# Patient Record
Sex: Female | Born: 1995 | Hispanic: No | Marital: Single | Smoking: Never smoker
Health system: Southern US, Community
[De-identification: ages and names within clinical notes are randomized; demographics above are authoritative.]

## PROBLEM LIST (undated history)

## (undated) ENCOUNTER — Inpatient Hospital Stay (HOSPITAL_COMMUNITY): Payer: Self-pay

## (undated) DIAGNOSIS — Z85858 Personal history of malignant neoplasm of other endocrine glands: Secondary | ICD-10-CM

## (undated) DIAGNOSIS — J189 Pneumonia, unspecified organism: Secondary | ICD-10-CM

## (undated) DIAGNOSIS — K769 Liver disease, unspecified: Secondary | ICD-10-CM

## (undated) DIAGNOSIS — Z862 Personal history of diseases of the blood and blood-forming organs and certain disorders involving the immune mechanism: Secondary | ICD-10-CM

## (undated) DIAGNOSIS — C801 Malignant (primary) neoplasm, unspecified: Secondary | ICD-10-CM

## (undated) DIAGNOSIS — Z9221 Personal history of antineoplastic chemotherapy: Secondary | ICD-10-CM

## (undated) DIAGNOSIS — J45909 Unspecified asthma, uncomplicated: Secondary | ICD-10-CM

## (undated) HISTORY — DX: Liver disease, unspecified: K76.9

## (undated) HISTORY — DX: Personal history of diseases of the blood and blood-forming organs and certain disorders involving the immune mechanism: Z86.2

## (undated) HISTORY — DX: Personal history of antineoplastic chemotherapy: Z92.21

## (undated) HISTORY — DX: Pneumonia, unspecified organism: J18.9

## (undated) HISTORY — DX: Malignant (primary) neoplasm, unspecified: C80.1

## (undated) HISTORY — DX: Personal history of malignant neoplasm of other endocrine glands: Z85.858

## (undated) HISTORY — PX: OTHER SURGICAL HISTORY: SHX169

---
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
## 2017-10-15 ENCOUNTER — Ambulatory Visit (INDEPENDENT_AMBULATORY_CARE_PROVIDER_SITE_OTHER): Payer: Medicaid Other | Admitting: *Deleted

## 2017-10-15 ENCOUNTER — Encounter: Payer: Self-pay | Admitting: General Practice

## 2017-10-15 ENCOUNTER — Other Ambulatory Visit: Payer: Self-pay

## 2017-10-15 VITALS — BP 123/84 | HR 96 | Temp 98.4°F | Ht 61.0 in | Wt 196.4 lb

## 2017-10-15 DIAGNOSIS — Z3200 Encounter for pregnancy test, result unknown: Secondary | ICD-10-CM

## 2017-10-15 DIAGNOSIS — Z3400 Encounter for supervision of normal first pregnancy, unspecified trimester: Secondary | ICD-10-CM

## 2017-10-15 DIAGNOSIS — Z3201 Encounter for pregnancy test, result positive: Secondary | ICD-10-CM

## 2017-10-15 LAB — POCT URINE PREGNANCY: Preg Test, Ur: POSITIVE — AB

## 2017-10-15 MED ORDER — PRENATAL GUMMIES/DHA & FA 0.4-32.5 MG PO CHEW: 3.0000 | CHEWABLE_TABLET | Freq: Every day | ORAL | 12 refills | Status: DC

## 2017-10-15 NOTE — Progress Notes (Signed)
     Ms. Bousquet presents today for UPT. She has no unusual complaints. LMP: 09/13/2017  EDD: 06/20/2018, G1P0; [redacted]w[redacted]d, dating LMP: 09/13/17.    OBJECTIVE: Appears well, in no apparent distress.  OB History    Gravida  1   Para      Term      Preterm      AB      Living        SAB      TAB      Ectopic      Multiple      Live Births             Home UPT Result:  Positive x 2 tests. In-Office UPT result: Positive I have reviewed the patient's medical, obstetrical, social, and family histories, and medications.   ASSESSMENT: Positive pregnancy test  PLAN Prenatal care to be completed at: [facility]. Prenatal Vitamins Return for nurse visit for complete history and blood work.   Derl Barrow, RN

## 2017-10-16 ENCOUNTER — Inpatient Hospital Stay (HOSPITAL_COMMUNITY): Payer: Self-pay

## 2017-10-16 ENCOUNTER — Encounter: Payer: Self-pay | Admitting: *Deleted

## 2017-10-16 ENCOUNTER — Inpatient Hospital Stay (HOSPITAL_COMMUNITY)
Admission: AD | Admit: 2017-10-16 | Discharge: 2017-10-16 | Disposition: A | Discharge status: Home / Self Care | Payer: Self-pay | Source: Ambulatory Visit | Attending: Obstetrics and Gynecology | Admitting: Obstetrics and Gynecology

## 2017-10-16 ENCOUNTER — Telehealth: Payer: Self-pay | Admitting: *Deleted

## 2017-10-16 ENCOUNTER — Encounter (HOSPITAL_COMMUNITY): Payer: Self-pay | Admitting: *Deleted

## 2017-10-16 DIAGNOSIS — R109 Unspecified abdominal pain: Secondary | ICD-10-CM | POA: Insufficient documentation

## 2017-10-16 DIAGNOSIS — Z3400 Encounter for supervision of normal first pregnancy, unspecified trimester: Secondary | ICD-10-CM | POA: Insufficient documentation

## 2017-10-16 DIAGNOSIS — Z3A01 Less than 8 weeks gestation of pregnancy: Secondary | ICD-10-CM | POA: Insufficient documentation

## 2017-10-16 DIAGNOSIS — O3680X0 Pregnancy with inconclusive fetal viability, not applicable or unspecified: Secondary | ICD-10-CM

## 2017-10-16 DIAGNOSIS — O26891 Other specified pregnancy related conditions, first trimester: Secondary | ICD-10-CM | POA: Insufficient documentation

## 2017-10-16 DIAGNOSIS — O209 Hemorrhage in early pregnancy, unspecified: Secondary | ICD-10-CM | POA: Insufficient documentation

## 2017-10-16 LAB — CBC
HCT: 39.6 % (ref 36.0–46.0)
HEMOGLOBIN: 13.7 g/dL (ref 12.0–15.0)
MCH: 29.7 pg (ref 26.0–34.0)
MCHC: 34.6 g/dL (ref 30.0–36.0)
MCV: 85.9 fL (ref 78.0–100.0)
Platelets: 228 10*3/uL (ref 150–400)
RBC: 4.61 MIL/uL (ref 3.87–5.11)
RDW: 12.9 % (ref 11.5–15.5)
WBC: 7.4 10*3/uL (ref 4.0–10.5)

## 2017-10-16 LAB — WET PREP, GENITAL
Sperm: NONE SEEN
TRICH WET PREP: NONE SEEN
YEAST WET PREP: NONE SEEN

## 2017-10-16 LAB — ABO/RH: ABO/RH(D): O POS

## 2017-10-16 LAB — HCG, QUANTITATIVE, PREGNANCY: hCG, Beta Chain, Quant, S: 5641 m[IU]/mL — ABNORMAL HIGH (ref <5)

## 2017-10-16 NOTE — Telephone Encounter (Signed)
Spoke with patient. Last sex was 2 days ago.  Pt advised to go to MAU for possible ectopic pregnancy work up per [employer], CMN.  Pt lives about 45 mins away, but she is leaving for MAU now.  Derl Barrow, RN

## 2017-10-16 NOTE — Telephone Encounter (Signed)
Ask the patient if she has recently had sexual intercourse...like last night? If the answer is no, then she needs to come to MAU for bloodwork.

## 2017-10-16 NOTE — MAU Note (Signed)
Pt reports spotting since this am, cramping just before she noticed the spotting but none now

## 2017-10-16 NOTE — Telephone Encounter (Signed)
Patient called stating she started spotting (pinkish) about 30 mins after waking up. Pt had positive pregnancy on 10/15/17; GA: [redacted]w[redacted]d. Bleeding is not enough to wear a pad or panty liner. Pt did have some abdominal cramping 4/10, but resolved.  Advised patient to make sure rest and keep hydrated.  If the cramping is severe and bleeding heavy, to [facility]. Advised patient to call clinic back within 24 hours to check if she is doing well.  Will forward to CNM for further advise.  Derl Barrow, RN

## 2017-10-16 NOTE — MAU Provider Note (Signed)
History     CSN: [id number]  Arrival date and time: 10/16/17 1259   First Provider Initiated Contact with Patient 10/16/17 1355      Chief Complaint  Patient presents with  . Vaginal Bleeding  . Abdominal Pain   HPI  Ms.  Annette Glenn is a 22 y.o. year old G52P0 female at [redacted]w[redacted]d weeks gestation who was sent to MAU for evaluation of vaginal spotting this AM after calling her OB provider's office this morning. She was seen at [facility] on 10/15/17. She had a (+) UPT and OB appts scheduled on 10/14 & 10/31.  Past Medical History:  Diagnosis Date  . Asthma   . Cancer ([place])   . History of chemotherapy    [id number]--Carboplatin, Cyclophosphamide, Doxorubicin, Etoposide  . 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 Hx of eosinophilia   . 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 Pneumonia     Past Surgical History:  Procedure Laterality Date  . Broviac Catheter removal  09/1998  . Femoral Line Placement  09/1998  . 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 Multiple Laparoscopic liver biopsies  03/1999  . Open Liver Biopsy  04/1998   Broviac Catheter placement    Family History  Problem Relation Age of Onset  . Diabetes Father     Social History   Tobacco Use  . Smoking status: Former Smoker    Packs/day: 0.25    Years: 1.00    Pack years: 0.25    Types: Cigarettes  . Smokeless tobacco: Never Used  Substance Use Topics  . Alcohol use: Never    Frequency: Never  . Drug use: Never    Allergies: No Known Allergies  Medications Prior to Admission  Medication Sig Dispense Refill Last Dose  . Prenatal MV-Min-FA-Omega-3 (PRENATAL GUMMIES/DHA & FA) 0.4-32.5 MG CHEW Chew 3 each by mouth daily. 90 tablet 12     Review of Systems  Constitutional: Negative.   HENT: Negative.   Eyes: Negative.   Respiratory: Negative.   Cardiovascular: Negative.   Endocrine: Negative.   Genitourinary: Positive for vaginal bleeding (spotting this AM, but none now).   Allergic/Immunologic: Negative.   Neurological: Negative.    Physical Exam   Blood pressure 123/62, pulse (!) 101, temperature 98.9 F (37.2 C), temperature source Oral, resp. rate 17, height 5\' 1"  (1.549 m), weight 88.9 kg, last menstrual period 09/13/2017, SpO2 100 %.  Physical Exam  Constitutional: She is oriented to person, place, and time. She appears well-developed and well-nourished.  HENT:  Head: Normocephalic and atraumatic.  Eyes: Pupils are equal, round, and reactive to light.  Neck: Normal range of motion.  Cardiovascular: Normal rate, regular rhythm, normal heart sounds and intact distal pulses.  Respiratory: Effort normal and breath sounds normal.  GI: Soft. Bowel sounds are normal.  Genitourinary:  Genitourinary Comments: Uterus: non-tender SE: cervix is smooth, pink, no lesions, scant amt of pink vaginal d/c, no active bleeding from cervical os -- WP, GC/CT done, closed/long/firm, no CMT or friability, no adnexal tenderness   Neurological: She is alert and oriented to person, place, and time. She has normal reflexes.  Skin: Skin is warm and dry.  Psychiatric: She has a normal mood and affect. Her behavior is normal. Judgment and thought content normal.    MAU Course  Procedures  MDM CCUA UCx -- pending UPT CBC ABO/Rh HCG Wet Prep GC/CT -- pending HIV -- pending OB < 14 wks [place] with TV  Results for orders placed or performed during the hospital encounter of 10/16/17 (from the past 24 hour(s))  CBC     Status: None   Collection Time: 10/16/17  1:26 PM  Result Value Ref Range   WBC 7.4 4.0 - 10.5 K/uL   RBC 4.61 3.87 - 5.11 MIL/uL   Hemoglobin 13.7 12.0 - 15.0 g/dL   HCT 39.6 36.0 - 46.0 %   MCV 85.9 78.0 - 100.0 fL   MCH 29.7 26.0 - 34.0 pg   MCHC 34.6 30.0 - 36.0 g/dL   RDW 12.9 11.5 - 15.5 %   Platelets 228 150 - 400 K/uL  ABO/Rh     Status: None (Preliminary result)   Collection Time: 10/16/17  1:26 PM  Result Value Ref Range   ABO/RH(D)       O POS Performed at [facility], [address] [place], [place] [address]   hCG, quantitative, pregnancy     Status: Abnormal   Collection Time: 10/16/17  1:26 PM  Result Value Ref Range   hCG, Beta Chain, Quant, S 5,641 (H) <5 mIU/mL  Wet prep, genital     Status: Abnormal   Collection Time: 10/16/17  2:13 PM  Result Value Ref Range   Yeast Wet Prep HPF POC NONE SEEN NONE SEEN   Trich, Wet Prep NONE SEEN NONE SEEN   Clue Cells Wet Prep HPF POC PRESENT (A) NONE SEEN   WBC, Wet Prep HPF POC FEW (A) NONE SEEN   Sperm NONE SEEN     US Ob Comp Less 14 Wks  Result Date: 10/16/2017 CLINICAL DATA:  Vaginal bleeding during 1st trimester of pregnancy. Gestational age by LMP of 4 weeks 5 days. EXAM: OBSTETRIC <14 WK ULTRASOUND TECHNIQUE: Transabdominal ultrasound was performed for evaluation of the gestation as well as the maternal uterus and adnexal regions. Patient declined transvaginal sonography. COMPARISON:  None. FINDINGS: Intrauterine gestational sac: Single Yolk sac:  Not visualized by transabdominal sonography Embryo:  Not visualized by transabdominal sonography MSD: 6 mm   5 w   2 d Subchorionic hemorrhage:  None visualized. Maternal uterus/adnexae: Both ovaries are normal in appearance. No mass or abnormal free fluid identified. IMPRESSION: Single intrauterine gestational sac measuring approximately 5 weeks with inconclusive viability. Suboptimal visualization as patient declined transvaginal sonography. Consider following b-hCG levels, with followup ultrasound to assess viability in 14 days. Electronically Signed   By: Earle Gell M.D.   On: 10/16/2017 14:44     Assessment and Plan  Bleeding in early pregnancy  - Advised pelvic rest until bleeding stopped - Advised to return to MAU for bleeding like a period and soaking a pad every hr  Pregnancy with uncertain fetal viability, single or unspecified fetus  - F/U OB < 14 wks U/S in 2 wks - Discharge home - Patient verbalized an  understanding of the plan of care and agrees.     Laury Deep, MSN, CNM 10/16/2017, 1:55 PM

## 2017-10-16 NOTE — Discharge Instructions (Signed)

## 2017-10-17 ENCOUNTER — Encounter: Payer: Self-pay | Admitting: General Practice

## 2017-10-17 LAB — CULTURE, OB URINE
Culture: 1000 — AB
Special Requests: NORMAL

## 2017-10-17 LAB — HIV ANTIBODY (ROUTINE TESTING W REFLEX): HIV SCREEN 4TH GENERATION: NONREACTIVE

## 2017-10-17 LAB — GC/CHLAMYDIA PROBE AMP (~~LOC~~) NOT AT ARMC
Chlamydia: NEGATIVE
NEISSERIA GONORRHEA: NEGATIVE

## 2017-11-25 ENCOUNTER — Other Ambulatory Visit: Payer: Self-pay

## 2017-11-25 ENCOUNTER — Ambulatory Visit: Payer: Medicaid Other | Admitting: *Deleted

## 2017-11-25 ENCOUNTER — Other Ambulatory Visit (HOSPITAL_COMMUNITY)
Admission: RE | Admit: 2017-11-25 | Discharge: 2017-11-25 | Disposition: A | Discharge status: Home / Self Care | Payer: Medicaid Other | Source: Ambulatory Visit | Attending: Obstetrics and Gynecology | Admitting: Obstetrics and Gynecology

## 2017-11-25 DIAGNOSIS — Z3400 Encounter for supervision of normal first pregnancy, unspecified trimester: Secondary | ICD-10-CM

## 2017-11-25 DIAGNOSIS — B9689 Other specified bacterial agents as the cause of diseases classified elsewhere: Secondary | ICD-10-CM | POA: Diagnosis not present

## 2017-11-25 DIAGNOSIS — N760 Acute vaginitis: Secondary | ICD-10-CM | POA: Insufficient documentation

## 2017-11-25 DIAGNOSIS — Z3401 Encounter for supervision of normal first pregnancy, first trimester: Secondary | ICD-10-CM | POA: Diagnosis not present

## 2017-11-25 DIAGNOSIS — O23599 Infection of other part of genital tract in pregnancy, unspecified trimester: Secondary | ICD-10-CM | POA: Insufficient documentation

## 2017-11-25 DIAGNOSIS — O3680X0 Pregnancy with inconclusive fetal viability, not applicable or unspecified: Secondary | ICD-10-CM

## 2017-11-25 LAB — POCT URINALYSIS DIPSTICK OB
Glucose, UA: NEGATIVE
Leukocytes, UA: NEGATIVE
NITRITE UA: NEGATIVE
PH UA: 6.5 (ref 5.0–8.0)
PROTEIN: NEGATIVE
SPEC GRAV UA: 1.025 (ref 1.010–1.025)
UROBILINOGEN UA: 1 U/dL

## 2017-11-25 NOTE — Progress Notes (Signed)
   PRENATAL INTAKE SUMMARY  Annette Glenn presents today New OB Nurse Interview.  OB History    Gravida  1   Para      Term      Preterm      AB      Living        SAB      TAB      Ectopic      Multiple      Live Births             I have reviewed the patient's medical, obstetrical, social, and family histories, medications, and available lab results.  SUBJECTIVE She has no unusual complaints.  OBJECTIVE Initial nurse interview for labs and history (New OB).  GENERAL APPEARANCE: alert, well appearing.   ASSESSMENT Normal pregnancy. EDD: 06/20/2018 confirmed by ultrasound 10/16/17. GA: [redacted]w[redacted]d; G1P0.  PLAN Prenatal care-[facility] OB Pnl/HIV  OB Urine Culture/Dip GC/CT HgbEval CF A1C Need to pick up PNV Ultrasound scheduled for tomorrow  Derl Barrow, RN

## 2017-11-26 ENCOUNTER — Other Ambulatory Visit: Payer: Self-pay | Admitting: Obstetrics and Gynecology

## 2017-11-26 ENCOUNTER — Ambulatory Visit (HOSPITAL_COMMUNITY)
Admission: RE | Admit: 2017-11-26 | Discharge: 2017-11-26 | Disposition: A | Discharge status: Home / Self Care | Payer: Medicaid Other | Source: Ambulatory Visit | Attending: Obstetrics and Gynecology | Admitting: Obstetrics and Gynecology

## 2017-11-26 ENCOUNTER — Ambulatory Visit (INDEPENDENT_AMBULATORY_CARE_PROVIDER_SITE_OTHER): Payer: Self-pay

## 2017-11-26 DIAGNOSIS — O3680X0 Pregnancy with inconclusive fetal viability, not applicable or unspecified: Secondary | ICD-10-CM | POA: Insufficient documentation

## 2017-11-26 DIAGNOSIS — Z712 Person consulting for explanation of examination or test findings: Secondary | ICD-10-CM

## 2017-11-26 DIAGNOSIS — Z3A10 10 weeks gestation of pregnancy: Secondary | ICD-10-CM | POA: Diagnosis not present

## 2017-11-26 LAB — CERVICOVAGINAL ANCILLARY ONLY
Bacterial vaginitis: POSITIVE — AB
Candida vaginitis: NEGATIVE
Chlamydia: NEGATIVE
Neisseria Gonorrhea: NEGATIVE

## 2017-11-26 NOTE — Progress Notes (Signed)
Pt here for [place] results:  FINDINGS: Intrauterine gestational sac: Single  Yolk sac:  Visualized  Embryo:  Visualized  Cardiac Activity: Visualized  Heart Rate: 169 bpm  MSD:   mm    w     d  CRL:   40.6 mm   10 w 6 d                  [place] EDC: 06/18/2018  Subchorionic hemorrhage:  No subchorionic hemorrhage  Maternal uterus/adnexae: No adnexal mass or free fluid.  IMPRESSION: Ten week 6 day intrauterine pregnancy. Fetal heart rate 169 beats per minute. No acute maternal findings.  Reviewed with Dr.Peggy Constant, states has viable pregnancy and advised can start Prenatal Vitamins , and Due date is 06/18/18. Pt verbalized understanding.

## 2017-11-26 NOTE — Progress Notes (Signed)
I have reviewed the chart and agree with nursing staff's documentation of this patient's encounter.  Mora Bellman, MD 11/26/2017 12:12 PM

## 2017-11-29 ENCOUNTER — Other Ambulatory Visit: Payer: Self-pay | Admitting: Obstetrics and Gynecology

## 2017-11-29 DIAGNOSIS — R8271 Bacteriuria: Secondary | ICD-10-CM

## 2017-11-29 LAB — CULTURE, OB URINE

## 2017-11-29 LAB — URINE CULTURE, OB REFLEX

## 2017-11-29 MED ORDER — AMOXICILLIN 500 MG PO CAPS: 500.0000 mg | ORAL_CAPSULE | Freq: Three times a day (TID) | ORAL | 0 refills | Status: AC

## 2017-11-29 NOTE — Progress Notes (Signed)
My Chart message to notify of Rx and need of taking it completely

## 2017-12-02 LAB — OBSTETRIC PANEL, INCLUDING HIV
ANTIBODY SCREEN: NEGATIVE
BASOS ABS: 0 10*3/uL (ref 0.0–0.2)
BASOS: 1 %
EOS (ABSOLUTE): 0.1 10*3/uL (ref 0.0–0.4)
Eos: 1 %
HEMATOCRIT: 39.2 % (ref 34.0–46.6)
HIV SCREEN 4TH GENERATION: NONREACTIVE
Hemoglobin: 13.4 g/dL (ref 11.1–15.9)
Hepatitis B Surface Ag: NEGATIVE
IMMATURE GRANS (ABS): 0 10*3/uL (ref 0.0–0.1)
Immature Granulocytes: 0 %
LYMPHS: 25 %
Lymphocytes Absolute: 1.7 10*3/uL (ref 0.7–3.1)
MCH: 29.2 pg (ref 26.6–33.0)
MCHC: 34.2 g/dL (ref 31.5–35.7)
MCV: 85 fL (ref 79–97)
MONOS ABS: 0.4 10*3/uL (ref 0.1–0.9)
Monocytes: 6 %
NEUTROS ABS: 4.5 10*3/uL (ref 1.4–7.0)
Neutrophils: 67 %
PLATELETS: 232 10*3/uL (ref 150–450)
RBC: 4.59 x10E6/uL (ref 3.77–5.28)
RDW: 13.3 % (ref 12.3–15.4)
RPR Ser Ql: NONREACTIVE
Rh Factor: POSITIVE
Rubella Antibodies, IGG: 2.13 index (ref >0.99)
WBC: 6.7 10*3/uL (ref 3.4–10.8)

## 2017-12-02 LAB — HEMOGLOBIN A1C
Est. average glucose Bld gHb Est-mCnc: 91 mg/dL
Hgb A1c MFr Bld: 4.8 % (ref 4.8–5.6)

## 2017-12-02 LAB — SICKLE CELL SCREEN: Sickle Cell Screen: NEGATIVE

## 2017-12-02 LAB — CYSTIC FIBROSIS MUTATION 97: Interpretation: NOT DETECTED

## 2017-12-10 ENCOUNTER — Telehealth: Payer: Self-pay | Admitting: Licensed Clinical Social Worker

## 2017-12-10 NOTE — Telephone Encounter (Signed)
Pt called to be reminded of upcoming appt.

## 2017-12-12 ENCOUNTER — Ambulatory Visit (INDEPENDENT_AMBULATORY_CARE_PROVIDER_SITE_OTHER): Payer: Medicaid Other | Admitting: Obstetrics and Gynecology

## 2017-12-12 ENCOUNTER — Encounter: Payer: Self-pay | Admitting: General Practice

## 2017-12-12 ENCOUNTER — Encounter: Payer: Self-pay | Admitting: Obstetrics and Gynecology

## 2017-12-12 VITALS — BP 119/78 | HR 96 | Wt 187.0 lb

## 2017-12-12 DIAGNOSIS — Z3400 Encounter for supervision of normal first pregnancy, unspecified trimester: Secondary | ICD-10-CM

## 2017-12-12 DIAGNOSIS — N760 Acute vaginitis: Secondary | ICD-10-CM

## 2017-12-12 DIAGNOSIS — R8271 Bacteriuria: Secondary | ICD-10-CM

## 2017-12-12 DIAGNOSIS — Z3687 Encounter for antenatal screening for uncertain dates: Secondary | ICD-10-CM

## 2017-12-12 DIAGNOSIS — Z3401 Encounter for supervision of normal first pregnancy, first trimester: Secondary | ICD-10-CM

## 2017-12-12 DIAGNOSIS — B9689 Other specified bacterial agents as the cause of diseases classified elsewhere: Secondary | ICD-10-CM

## 2017-12-12 MED ORDER — METRONIDAZOLE 500 MG PO TABS: 500.0000 mg | ORAL_TABLET | Freq: Two times a day (BID) | ORAL | 0 refills | Status: DC

## 2017-12-12 NOTE — Progress Notes (Addendum)
  Subjective:    Annette Glenn is being seen today for her first obstetrical visit.  This is not a planned pregnancy. She is at [redacted]w[redacted]d gestation. Her obstetrical history is significant for group B strep colonizer and teen pregnancy. Relationship with FOB:(Erik) significant other, not living together. Patient is unsure intend to breast feed. Pregnancy history fully reviewed.  Patient reports headache.  Review of Systems:   Review of Systems  Constitutional: Positive for appetite change (decreased).  HENT: Negative.   Eyes: Negative.   Respiratory: Negative.   Cardiovascular: Negative.   Gastrointestinal: Negative.   Endocrine: Negative.   Genitourinary: Negative.   Musculoskeletal: Negative.   Skin: Negative.   Allergic/Immunologic: Negative.   Neurological: Positive for headaches (since onset of pregnancy, mostly toward the end of the day).  Hematological: Negative.   Psychiatric/Behavioral: Negative.     Objective:     BP 119/78   Pulse 96   Wt 84.8 kg   LMP 09/13/2017 (Approximate)   BMI 35.33 kg/m  Physical Exam  Nursing note and vitals reviewed. Constitutional: She is oriented to person, place, and time. She appears well-developed and well-nourished.  HENT:  Head: Normocephalic.  Neck: Normal range of motion.  Cardiovascular: Normal rate.  Respiratory: Effort normal.  GI: Soft.  Musculoskeletal: Normal range of motion.  Neurological: She is alert and oriented to person, place, and time.  Skin: Skin is warm and dry.  Psychiatric: She has a normal mood and affect. Her behavior is normal. Judgment and thought content normal.      Fetal Exam Fetal Monitor Review: Mode: hand-held doppler probe and ultrasound.   Baseline rate: 160.      Patient informed that the ultrasound is considered a limited OB ultrasound and is not intended to be a complete ultrasound exam.  Patient also informed that the ultrasound is not being completed with the intent of assessing for  fetal or placental anomalies or any pelvic abnormalities.  Explained that the purpose of today's ultrasound is to assess for  viability.  Patient acknowledges the purpose of the exam and the limitations of the study.  Ultrasound performed by Laury Deep, CNM    Assessment:    Pregnancy: G1P0 Patient Active Problem List   Diagnosis Date Noted  . Supervision of normal first pregnancy, antepartum 10/16/2017  . Pregnancy with uncertain fetal viability, not applicable or unspecified fetus 10/16/2017       Plan:     Initial labs drawn. Prenatal vitamins. Problem list reviewed and updated. AFP3 discussed: undecided. Role of ultrasound in pregnancy discussed; fetal survey: ordered. Amniocentesis discussed: not indicated. Follow up in 4 weeks. 50% of 40 min visit spent on counseling and coordination of care.  (+) GBS status, nutrition/hydration, and breastfeeding plans discussed, found FHT after bedside U/S.   Gabriel Carina 12/12/2017

## 2017-12-12 NOTE — Progress Notes (Signed)
Couldn't get FHT with doppler

## 2018-01-08 ENCOUNTER — Encounter: Payer: Medicaid Other | Admitting: Nurse Practitioner

## 2018-01-15 ENCOUNTER — Encounter (HOSPITAL_COMMUNITY): Payer: Self-pay

## 2018-01-23 ENCOUNTER — Other Ambulatory Visit (HOSPITAL_COMMUNITY): Payer: Self-pay | Admitting: *Deleted

## 2018-01-23 ENCOUNTER — Ambulatory Visit (HOSPITAL_COMMUNITY)
Admission: RE | Admit: 2018-01-23 | Discharge: 2018-01-23 | Disposition: A | Discharge status: Home / Self Care | Payer: Medicaid Other | Source: Ambulatory Visit | Attending: Obstetrics and Gynecology | Admitting: Obstetrics and Gynecology

## 2018-01-23 ENCOUNTER — Ambulatory Visit (HOSPITAL_COMMUNITY): Payer: Medicaid Other

## 2018-01-23 DIAGNOSIS — Z363 Encounter for antenatal screening for malformations: Secondary | ICD-10-CM | POA: Insufficient documentation

## 2018-01-23 DIAGNOSIS — Z3A18 18 weeks gestation of pregnancy: Secondary | ICD-10-CM | POA: Insufficient documentation

## 2018-01-23 DIAGNOSIS — Z3685 Encounter for antenatal screening for Streptococcus B: Secondary | ICD-10-CM | POA: Diagnosis not present

## 2018-01-23 DIAGNOSIS — Z362 Encounter for other antenatal screening follow-up: Secondary | ICD-10-CM

## 2018-01-23 DIAGNOSIS — Z3400 Encounter for supervision of normal first pregnancy, unspecified trimester: Secondary | ICD-10-CM

## 2018-01-29 ENCOUNTER — Ambulatory Visit (INDEPENDENT_AMBULATORY_CARE_PROVIDER_SITE_OTHER): Payer: Medicaid Other | Admitting: Obstetrics and Gynecology

## 2018-01-29 VITALS — BP 124/78 | HR 99 | Wt 189.0 lb

## 2018-01-29 DIAGNOSIS — Z3402 Encounter for supervision of normal first pregnancy, second trimester: Secondary | ICD-10-CM

## 2018-01-29 NOTE — Progress Notes (Addendum)
   PRENATAL VISIT NOTE  Subjective:  Annette Glenn is a 22 y.o. G1P0 at [redacted]w[redacted]d being seen today for ongoing prenatal care.  She is currently monitored for the following issues for this low-risk pregnancy and has Supervision of normal first pregnancy, antepartum and Pregnancy with uncertain fetal viability, not applicable or unspecified fetus on their problem list.  Patient reports no complaints.  Contractions: Not present. Vag. Bleeding: None.  Movement: Present. Denies leaking of fluid. Has not taken Flagyl as prescribed. Wants to breastfeed, potentially use POPs for birth control, and wants an outpatient circumcision for her son.  The following portions of the patient's history were reviewed and updated as appropriate: allergies, current medications, past family history, past medical history, past social history, past surgical history and problem list. Problem list updated.  Objective:   Vitals:   01/29/18 0900  BP: 124/78  Pulse: 99  Weight: 85.7 kg    Fetal Status: Fetal Heart Rate (bpm): 150 Fundal Height: 20 cm Movement: Present     General:  Alert, oriented and cooperative. Patient is in no acute distress.  Skin: Skin is warm and dry. No rash noted.   Cardiovascular: Normal heart rate noted  Respiratory: Normal respiratory effort, no problems with respiration noted  Abdomen: Soft, gravid, appropriate for gestational age.  Pain/Pressure: Absent     Pelvic: Cervical exam deferred        Extremities: Normal range of motion.  Edema: None  Mental Status: Normal mood and affect. Normal behavior. Normal judgment and thought content.   Assessment and Plan:  Pregnancy: G1P0 at [redacted]w[redacted]d Encounter for supervision of normal first pregnancy in second trimester - Discussed the importance of taking abx for (+) GBS bacteriuria - Patient will p/u Rx and start taking them as prescribed  Allergies as of 01/29/2018   No Known Allergies     Medication List       Accurate as of January 29, 2018 11:52 AM. Always use your most recent med list.        metroNIDAZOLE 500 MG tablet Commonly known as:  FLAGYL Take 1 tablet (500 mg total) by mouth 2 (two) times daily.   PRENATAL GUMMIES/DHA & FA 0.4-32.5 MG Chew Chew 3 each by mouth daily.      Preterm labor symptoms and general obstetric precautions including but not limited to vaginal bleeding, contractions, leaking of fluid and fetal movement were reviewed in detail with the patient. Please refer to After Visit Summary for other counseling recommendations.   Future Appointments  Date Time Provider [place]  02/20/2018 10:45 AM [place] 2 WH-MFCUS MFC-US  02/27/2018  3:30 PM Laury Deep, [place] None    Gabriel Carina, Student-MidWife

## 2018-02-12 NOTE — L&D Delivery Note (Signed)
OB/GYN Faculty Practice Delivery Note  JESSABELLE MARKIEWICZ is a 23 y.o. now G1P0 s/p SVD at [redacted]w[redacted]d who was admitted for IOL for post-dates pregnancy.   ROM: 4h 39m with clear fluid GBS Status: Positive Maximum Maternal Temperature: 100.2 (just prior to delivery)  Delivery Note At 6:31 AM a viable female was delivered via Vaginal, Spontaneous (Presentation: LOA).  APGAR: 8, 9; weight 8 lbs 5 oz (3770 gm).   Placenta status: spontaneous via Delena Bali, intact.  Cord: 3VC with no complications.  Cord pH: n/a  Anesthesia: Epidural Episiotomy: None Lacerations: 1st degree; RT Labia minora Suture Repair: 4.0 vicryl QBL (mL): 343  Postpartum Planning [x]  Mom to postpartum.  Baby to Couplet care / Skin to Skin. [x]  plans to breast & bottle feed [x]  message to sent to schedule follow-up  [x]  vaccines UTD  Laury Deep, MSN, CNM 06/28/18, 6:57 AM

## 2018-02-20 ENCOUNTER — Ambulatory Visit (HOSPITAL_COMMUNITY)
Admission: RE | Admit: 2018-02-20 | Discharge: 2018-02-20 | Disposition: A | Discharge status: Home / Self Care | Payer: Medicaid Other | Source: Ambulatory Visit | Attending: Obstetrics and Gynecology | Admitting: Obstetrics and Gynecology

## 2018-02-20 DIAGNOSIS — O99212 Obesity complicating pregnancy, second trimester: Secondary | ICD-10-CM

## 2018-02-20 DIAGNOSIS — Z3A22 22 weeks gestation of pregnancy: Secondary | ICD-10-CM | POA: Diagnosis not present

## 2018-02-20 DIAGNOSIS — Z362 Encounter for other antenatal screening follow-up: Secondary | ICD-10-CM | POA: Insufficient documentation

## 2018-02-27 ENCOUNTER — Ambulatory Visit (INDEPENDENT_AMBULATORY_CARE_PROVIDER_SITE_OTHER): Payer: Medicaid Other | Admitting: Obstetrics and Gynecology

## 2018-02-27 VITALS — BP 116/76 | HR 116 | Wt 189.8 lb

## 2018-02-27 DIAGNOSIS — Z3402 Encounter for supervision of normal first pregnancy, second trimester: Secondary | ICD-10-CM

## 2018-02-27 NOTE — Progress Notes (Signed)
   PRENATAL VISIT NOTE  Subjective:  Annette Glenn is a 23 y.o. G1P0 at [redacted]w[redacted]d being seen today for ongoing prenatal care.  She is currently monitored for the following issues for this low-risk pregnancy and has Supervision of normal first pregnancy, antepartum and Pregnancy with uncertain fetal viability, not applicable or unspecified fetus on their problem list.  Patient reports no complaints.  Contractions: Not present. Vag. Bleeding: None.  Movement: Present. Denies leaking of fluid.   The following portions of the patient's history were reviewed and updated as appropriate: allergies, current medications, past family history, past medical history, past social history, past surgical history and problem list. Problem list updated.  Objective:   Vitals:   02/27/18 1528  BP: 116/76  Pulse: (!) 116  Weight: 86.1 kg    Fetal Status: Fetal Heart Rate (bpm): 159 Fundal Height: 24 cm Movement: Present     General:  Alert, oriented and cooperative. Patient is in no acute distress.  Skin: Skin is warm and dry. No rash noted.   Cardiovascular: Normal heart rate noted  Respiratory: Normal respiratory effort, no problems with respiration noted  Abdomen: Soft, gravid, appropriate for gestational age.  Pain/Pressure: Absent     Pelvic: Cervical exam deferred        Extremities: Normal range of motion.  Edema: None  Mental Status: Normal mood and affect. Normal behavior. Normal judgment and thought content.   Assessment and Plan:  Pregnancy: G1P0 at [redacted]w[redacted]d  1. Encounter for supervision of normal first pregnancy in second trimester - Discussed 2-hr GTT at next visit and need to fast after 12am before her next visit. Patient verbalized understanding.  Preterm labor symptoms and general obstetric precautions including but not limited to vaginal bleeding, contractions, leaking of fluid and fetal movement were reviewed in detail with the patient. Please refer to After Visit Summary for other  counseling recommendations.  Return in about 4 weeks (around 03/27/2018) for ROB & GTT.  Future Appointments  Date Time Provider [place]  03/28/2018  8:30 AM [place] None  03/28/2018  8:50 AM Laury Deep, [place] None    Gabriel Carina, Student-MidWife

## 2018-03-28 ENCOUNTER — Encounter: Payer: Self-pay | Admitting: General Practice

## 2018-03-28 ENCOUNTER — Other Ambulatory Visit: Payer: Medicaid Other | Admitting: *Deleted

## 2018-03-28 ENCOUNTER — Other Ambulatory Visit: Payer: Self-pay

## 2018-03-28 ENCOUNTER — Ambulatory Visit (INDEPENDENT_AMBULATORY_CARE_PROVIDER_SITE_OTHER): Payer: Medicaid Other | Admitting: Obstetrics and Gynecology

## 2018-03-28 ENCOUNTER — Encounter: Payer: Self-pay | Admitting: *Deleted

## 2018-03-28 VITALS — BP 127/80 | HR 106 | Wt 194.0 lb

## 2018-03-28 DIAGNOSIS — Z3400 Encounter for supervision of normal first pregnancy, unspecified trimester: Secondary | ICD-10-CM

## 2018-03-28 DIAGNOSIS — Z3403 Encounter for supervision of normal first pregnancy, third trimester: Secondary | ICD-10-CM | POA: Diagnosis not present

## 2018-03-28 DIAGNOSIS — Z23 Encounter for immunization: Secondary | ICD-10-CM | POA: Diagnosis not present

## 2018-03-28 NOTE — Patient Instructions (Signed)

## 2018-03-28 NOTE — Progress Notes (Signed)
   Patient in clinic today for 28 weeks lab work.  Derl Barrow, RN

## 2018-03-28 NOTE — Progress Notes (Signed)
   PRENATAL VISIT NOTE  Subjective:  ADEENA BERNABE is a 23 y.o. G1P0 at [redacted]w[redacted]d being seen today for ongoing prenatal care.  She is currently monitored for the following issues for this low-risk pregnancy and has Supervision of normal first pregnancy, antepartum and Pregnancy with uncertain fetal viability, not applicable or unspecified fetus on their problem list.  Patient reports no complaints.  Contractions: Not present. Vag. Bleeding: None.  Movement: Present. Denies leaking of fluid.   The following portions of the patient's history were reviewed and updated as appropriate: allergies, current medications, past family history, past medical history, past social history, past surgical history and problem list. Problem list updated.  Objective:   Vitals:   03/28/18 0851  BP: 127/80  Pulse: (!) 106  Weight: 194 lb (88 kg)    Fetal Status: Fetal Heart Rate (bpm): 145 Fundal Height: 30 cm Movement: Present     General:  Alert, oriented and cooperative. Patient is in no acute distress.  Skin: Skin is warm and dry. No rash noted.   Cardiovascular: Normal heart rate noted  Respiratory: Normal respiratory effort, no problems with respiration noted  Abdomen: Soft, gravid, appropriate for gestational age.  Pain/Pressure: Absent     Pelvic: Cervical exam deferred        Extremities: Normal range of motion.  Edema: None  Mental Status: Normal mood and affect. Normal behavior. Normal judgment and thought content.   Assessment and Plan:  Pregnancy: G1P0 at [redacted]w[redacted]d  1. Supervision of normal first pregnancy, antepartum - HIV Antibody (routine testing w rflx) - RPR - CBC - Culture, OB Urine - Tdap vaccine greater than or equal to 7yo IM - SMN1 COPY NUMBER ANALYSIS (SMA Carrier Screen)  Preterm labor symptoms and general obstetric precautions including but not limited to vaginal bleeding, contractions, leaking of fluid and fetal movement were reviewed in detail with the patient. Please refer  to After Visit Summary for other counseling recommendations.  Return in about 2 weeks (around 04/11/2018) for Return OB visit.   Laury Deep, CNM

## 2018-03-29 LAB — GLUCOSE TOLERANCE, 2 HOURS W/ 1HR
Glucose, 1 hour: 138 mg/dL (ref 65–179)
Glucose, 2 hour: 88 mg/dL (ref 65–152)
Glucose, Fasting: 71 mg/dL (ref 65–91)

## 2018-03-29 LAB — CBC
Hematocrit: 34.1 % (ref 34.0–46.6)
Hemoglobin: 11.8 g/dL (ref 11.1–15.9)
MCH: 30.6 pg (ref 26.6–33.0)
MCHC: 34.6 g/dL (ref 31.5–35.7)
MCV: 88 fL (ref 79–97)
Platelets: 199 10*3/uL (ref 150–450)
RBC: 3.86 x10E6/uL (ref 3.77–5.28)
RDW: 13.4 % (ref 11.7–15.4)
WBC: 9.5 10*3/uL (ref 3.4–10.8)

## 2018-03-29 LAB — RPR: RPR Ser Ql: NONREACTIVE

## 2018-03-29 LAB — HIV ANTIBODY (ROUTINE TESTING W REFLEX): HIV Screen 4th Generation wRfx: NONREACTIVE

## 2018-03-30 LAB — URINE CULTURE, OB REFLEX: Organism ID, Bacteria: NO GROWTH

## 2018-03-30 LAB — CULTURE, OB URINE

## 2018-04-07 LAB — SMN1 COPY NUMBER ANALYSIS (SMA CARRIER SCREENING)

## 2018-04-11 ENCOUNTER — Ambulatory Visit (INDEPENDENT_AMBULATORY_CARE_PROVIDER_SITE_OTHER): Payer: Medicaid Other | Admitting: Obstetrics and Gynecology

## 2018-04-11 ENCOUNTER — Other Ambulatory Visit: Payer: Self-pay

## 2018-04-11 VITALS — BP 124/80 | HR 71 | Wt 193.4 lb

## 2018-04-11 DIAGNOSIS — Z3A30 30 weeks gestation of pregnancy: Secondary | ICD-10-CM

## 2018-04-11 DIAGNOSIS — Z3403 Encounter for supervision of normal first pregnancy, third trimester: Secondary | ICD-10-CM

## 2018-04-11 NOTE — Progress Notes (Signed)
   PRENATAL VISIT NOTE  Subjective:  Annette Glenn is a 23 y.o. G1P0 at [redacted]w[redacted]d being seen today for ongoing prenatal care.  She is currently monitored for the following issues for this low-risk pregnancy and has Supervision of normal first pregnancy, antepartum and Pregnancy with uncertain fetal viability, not applicable or unspecified fetus on their problem list.  Patient reports no complaints.  Contractions: Not present. Vag. Bleeding: None.  Movement: Present. Denies leaking of fluid. Had questions about timing of IOL - she has family coming in from out of town for the birth and wanted to discuss timing.  The following portions of the patient's history were reviewed and updated as appropriate: allergies, current medications, past family history, past medical history, past social history, past surgical history and problem list. Problem list updated.  Objective:   Vitals:   04/11/18 1001  BP: 124/80  Pulse: 71  Weight: 87.7 kg    Fetal Status: Fetal Heart Rate (bpm): 135 Fundal Height: 30 cm Movement: Present     General:  Alert, oriented and cooperative. Patient is in no acute distress.  Skin: Skin is warm and dry. No rash noted.   Cardiovascular: Normal heart rate noted  Respiratory: Normal respiratory effort, no problems with respiration noted  Abdomen: Soft, gravid, appropriate for gestational age.  Pain/Pressure: Absent     Pelvic: Cervical exam deferred        Extremities: Normal range of motion.  Edema: None  Mental Status: Normal mood and affect. Normal behavior. Normal judgment and thought content.   Assessment and Plan:  Pregnancy: G1P0 at [redacted]w[redacted]d  1. Encounter for supervision of normal first pregnancy in third trimester - Discussed timing of IOL, including [facility]'s standard of 41wk induction for post-dates unless otherwise indicated.  Preterm labor symptoms and general obstetric precautions including but not limited to vaginal bleeding, contractions, leaking of fluid and  fetal movement were reviewed in detail with the patient. Please refer to After Visit Summary for other counseling recommendations.  Return in about 2 weeks (around 04/25/2018) for [place].  Future Appointments  Date Time Provider [place]  04/25/2018 10:30 AM Jorje Guild, NP [facility] None  05/09/2018 10:30 AM Laury Deep, CNM [facility] None  05/22/2018 10:10 AM Laury Deep, CNM [facility] None  05/29/2018 10:10 AM Laury Deep, CNM [facility] None  06/05/2018 10:10 AM Laury Deep, CNM [facility] None  06/12/2018 10:10 AM Laury Deep, CNM [facility] None    Gabriel Carina, Student-MidWife

## 2018-04-25 ENCOUNTER — Other Ambulatory Visit: Payer: Self-pay

## 2018-04-25 ENCOUNTER — Ambulatory Visit (INDEPENDENT_AMBULATORY_CARE_PROVIDER_SITE_OTHER): Payer: Medicaid Other | Admitting: Student

## 2018-04-25 VITALS — BP 127/79 | HR 97 | Wt 194.0 lb

## 2018-04-25 DIAGNOSIS — Z3403 Encounter for supervision of normal first pregnancy, third trimester: Secondary | ICD-10-CM

## 2018-04-25 DIAGNOSIS — Z3400 Encounter for supervision of normal first pregnancy, unspecified trimester: Secondary | ICD-10-CM

## 2018-04-25 DIAGNOSIS — Z3A32 32 weeks gestation of pregnancy: Secondary | ICD-10-CM

## 2018-04-25 NOTE — Progress Notes (Signed)
   PRENATAL VISIT NOTE  Subjective:  Annette Glenn is a 23 y.o. G1P0 at [redacted]w[redacted]d being seen today for ongoing prenatal care.  She is currently monitored for the following issues for this low-risk pregnancy and has Supervision of normal first pregnancy, antepartum on their problem list.  Patient reports no complaints.  Contractions: Not present. Vag. Bleeding: None.  Movement: Present. Denies leaking of fluid.   The following portions of the patient's history were reviewed and updated as appropriate: allergies, current medications, past family history, past medical history, past social history, past surgical history and problem list.   Objective:   Vitals:   04/25/18 1048  BP: 127/79  Pulse: 97  Weight: 194 lb (88 kg)    Fetal Status: Fetal Heart Rate (bpm): 139 Fundal Height: 34 cm Movement: Present     General:  Alert, oriented and cooperative. Patient is in no acute distress.  Skin: Skin is warm and dry. No rash noted.   Cardiovascular: Normal heart rate noted  Respiratory: Normal respiratory effort, no problems with respiration noted  Abdomen: Soft, gravid, appropriate for gestational age.  Pain/Pressure: Absent     Pelvic: Cervical exam deferred        Extremities: Normal range of motion.  Edema: None  Mental Status: Normal mood and affect. Normal behavior. Normal judgment and thought content.   Assessment and Plan:  Pregnancy: G1P0 at [redacted]w[redacted]d 1. Supervision of normal first pregnancy, antepartum -doing well -undecided peds, give list today -wants circumcision -- info given for costs & locations  Preterm labor symptoms and general obstetric precautions including but not limited to vaginal bleeding, contractions, leaking of fluid and fetal movement were reviewed in detail with the patient. Please refer to After Visit Summary for other counseling recommendations.   Return in about 2 weeks (around 05/09/2018) for Routine OB.  Future Appointments  Date Time Provider [place]  05/09/2018 10:30 AM Laury Deep, CNM [facility] None  05/22/2018 10:10 AM Laury Deep, CNM [facility] None  05/29/2018 10:10 AM Laury Deep, CNM [facility] None  06/05/2018 10:10 AM Laury Deep, CNM [facility] None  06/12/2018 10:10 AM Laury Deep, CNM [facility] None    Jorje Guild, NP

## 2018-04-25 NOTE — Patient Instructions (Signed)
AREA PEDIATRIC/FAMILY PRACTICE PHYSICIANS  [place] ([address]) . [facility] o Chambliss, MD; Eniola, MD; Hale, MD; Hensel, MD; McDiarmid, MD; McIntyer, MD; Neal, MD; Walden, MD o [address] [place], [place] [address] o [phone] o Mon-Fri 8:30-12:30, 1:30-5:00 o Providers come to see babies at [facility] o Accepting Medicaid . [facility] o Limited providers who accept newborns: Koirala, MD; Morrow, MD; Wolters, MD o [address], [place], [place] [address] o [phone] o Mon-Fri 8:00-5:30 o Babies seen by providers at [facility] o Does NOT accept Medicaid o Please call early in hospitalization for appointment (limited availability)  . [facility] o Mulberry, MD o [address] [place], [place] [address] o [phone] o Mon, Tue, Thur, Fri 8:30-5:00, Wed 10:00-7:00 (closed 1-2pm) o Babies seen by [facility] providers o Accepting Medicaid . [facility] - Pediatrician o Rubin, MD o [address], [place], [place] [address] o [phone] o Mon-Fri 8:30-5:00, Sat 8:30-12:00 o Provider comes to see babies at [facility] o Accepting Medicaid o Must have been referred from current patients or contacted office prior to delivery . [facility] ([facility]) o Brown, MD; Chandler, MD; Ettefagh, MD; Grant, MD; Lester, MD; McCormick, MD; McQueen, MD; Prose, MD; Simha, MD; Stanley, MD; Stryffeler, NP; Tebben, NP o [address], [place], [place] [address] o [phone] o Mon, Tue, Thur, Fri 8:30-5:30, Wed 9:30-5:30, Sat 8:30-12:30 o Babies seen by [facility] providers o Accepting Medicaid o Only accepting infants of first-time parents or siblings of current patients o Hospital discharge coordinator will make follow-up appointment . Jack Amos o [address],  [place], [place]  [address] o [phone]   Fax - [phone] . [facility] o [address], [place], [place]  [address] o Phone - [phone]   Fax - [phone] . Shilpa Gosrani o [address], [place], [place]  [address] o [phone]  [place] ([address]) . [facility] o Bates, MD; [place], MD; Cooper, Cox, MD; MD; Davis, MD; Dovico, MD; Ettefaugh, MD; Little, MD; Lowe, MD; Keiffer, MD; Melvin, MD; Sumner, MD; Williams, MD o [address], [place], [place] [address] o [phone] o Mon-Fri 8:30-5:00 (extended evenings Mon-Thur as needed), Sat-Sun 10:00-1:00 o Providers come to see babies at [facility] o Accepting Medicaid for families of first-time babies and families with all children in the household age 3 and under. Must register with office prior to making appointment (M-F only). . [facility] o Henson, NP; Knapp, MD; Lalonde, MD; Tysinger, PA o [address] [place], [place] [address] o [phone] o Mon-Fri 8:00-5:00 o Babies seen by providers at [facility] o Does NOT accept Medicaid/Commercial Insurance Only . [facility] [place] ([facility])  o Artis, MD; Barnes, MD; Bratton, MD; Coccaro, MD; Lockett Gardner, MD; Kramer, MD; Marshall, MD; Netherton, MD; Poleto, MD; Skinner, MD o [address] [place], [place] [address] o [phone] o Mon-Fri 8:30-5:30, Sat (Oct.-Mar.) 9:00-1:00 o Babies seen by providers at [facility] o Accepting Medicaid  [place] ([address]) . [facility] o Reid, MD; Warner, MD o [address], [place], [place] [address] o [phone] o Mon-Fri 8:30-5:00, Sat 8:30-12:00 o Providers come to see babies at [facility] o Does NOT accept Medicaid . [facility] o Becker, PA; Hagler, MD; Scifres, PA; Sun, MD; Swayne, MD o [address],  [place], [place] [address] o [phone] o Mon-Fri 8:00-5:00 o Babies seen by providers at [facility] o Does NOT accept Medicaid o Only accepting babies of parents who are patients o Please call early in hospitalization for appointment (limited availability) . [facility] o Clark, MD; Frye, MD; Kelleher, MD; Mack, NP; Miller, MD; O'Keller, MD; Patterson, NP; Pudlo, MD; Puzio, MD; Thomas, MD; Tucker, MD; Twiselton, MD o [address], [place], [place] [address] o [phone] o Mon-Fri 8:00-5:00, Sat 9:00-12:00 o Providers come to see babies at [facility] o Does NOT accept Medicaid  [place] ([address]) . [facility] o Limited providers accepting new patients: Brake, NP; Wharton, PA o [address], [place], [place] [address] o [phone] o Mon-Fri 8:00-5:00 o Babies seen by providers at [facility] o Does NOT accept Medicaid o Only accepting babies of parents who are patients o Please call early in hospitalization for appointment (limited availability) . [facility] o Gay, MD; Quinlan, MD o [address] [place], [place] [address] o [phone] (press 1 to schedule appointment) o Mon-Fri 8:00-5:00 o Providers come to see babies at [facility] o Does NOT accept Medicaid . [facility] o Mazer, MD o [address] [place], [place] [address] o [phone] o Mon-Fri 8:30-5:00 (lunch 12:30-1:00), extended hours by appointment only Wed 5:00-6:30 o Babies seen by [facility] providers o Accepting Medicaid . [facility] o Banks, MD; Jordan, MD; Koberlein, MD o [address], [place], [place] [address] o [phone] o Mon-Fri 8:00-5:00 o Babies seen by [facility] providers o Does NOT accept Medicaid . [facility] o Parker, MD; Hunter, MD; Wallace, DO o [address] [place], [place]  [address] o [phone] o Mon-Fri 8:00-5:00 o Babies seen by [facility] providers o Does NOT accept Medicaid . [facility] o Brandon, PA; Brecken, PA; Christy, NP; Dees, MD; DeClaire, MD; DeWeese, MD; Hansen, NP; Mills, NP; [place], NP; Smoot, NP; Summer, MD; Vapne, MD o [address] [place], [place] [address] o [phone] o Mon-Fri 8:30-5:00, Sat 10:00-1:00 o Providers come to see babies at [facility] o Does NOT accept Medicaid o Free prenatal information session Tuesdays at 4:45pm . [facility] o Bouska, MD; Gordon, PA; Jeffery, PA; Weber, PA o [address] [place] [address] o [phone] o Mon-Fri 7:30-5:30 o Babies seen by [facility] providers . [facility] o [address], [place], [place]  [address] o [phone]   Fax - [phone]  [place] ([address] & [phone]) . [facility] o Reese, MD o [address] [place], [place] [address] o [phone] o Mon-Thur 8:00-6:00 o Providers come to see babies at [facility] o Accepting Medicaid . [facility] o Anderson, NP; Badger, MD; Beal, PA; Spencer, PA o [address] [place],  [address] o [phone] o Mon-Thur 7:30-7:30, Fri 7:30-4:30 o Babies seen by [facility] providers o Accepting Medicaid . [facility] o Agbuya, MD; Klett, NP; Romgoolam, MD o [address], [place],  [address] o [phone] o Mon-Fri 8:30-5:00, Sat 8:30-12:00 o Providers come to see babies at [facility] o Accepting Medicaid o Must have "Meet & Greet" appointment at office prior to delivery . [facility]  ([facility] ) o McCord,   MD; Juleen China, MD; Clydene Laming, [place] [address], [place], [place] [address] o [phone] o Mon-Wed 8:00-6:00, Thur-Fri 8:00-5:00, Sat 9:00-12:00 o Providers come to  see babies at [facility] o Does NOT accept Medicaid o Only accepting siblings of current patients . [facility] [place], [place], [place], [place]  [address] o [phone]   Fax [phone] . [facility] [place] [address], [place], [place]  [address] o [phone]   Fax - Morton Gorman [phone] & [phone]) . Therapist, music at [place], DO; [place], [address] [place], [place] [address] o [phone] o Mon-Fri 7:00-5:00 o Babies seen by [facility] providers o Does NOT accept Medicaid . [place], MD; [place], [place]; [place], [place], [place], [place] [address] o [phone] o Mon-Fri 8:00-5:00 o Babies seen by [facility] providers o Accepting Medicaid . Lamont, MD; [place], [place]; [place], NP; [place], [place], [place], [place] [address] o [phone] o Mon-Fri 8:00-5:00 o Babies seen by providers at [facility] [place] [phone]) . [facility] [place], [place] o [address] [place], [place] [address] o [phone] o Mon-Fri 8:00-5:00 o Babies seen by [facility] providers o Does NOT accept Medicaid o Limited availability, please call early in hospitalization to schedule follow-up . [facility] Leilani Merl, PA; Maisie Fus, MD; [place], MD; [place], [place]; Jeannine Kitten, MD; [place], [place] [facility] [address], [place], [place] [address] o [phone] o Mon-Fri 8:30-5:00, Sat 9:00-12:00 o Babies seen by providers at [facility] o Accepting Medicaid o Please register online then schedule online or call office o [email or web address] . [place] ([place]  [place]) Kristian Covey, NP; Dwyane Dee, MD; Leonidas Romberg, PA o [address] [place], [place], [place] [address] o [phone] o Mon-Fri 8:00-5:00 o Babies seen by providers at [facility] o Accepting Medicaid . [place] ([facility]) Dairl Ponder, MD; Rayvon Char, NP; Melina Modena, MD o [address] [place], [place], [place] [address] o [phone] o Mon-Fri 8:00-5:30, Sat&Sun by appointment (phones open at 8:30) o Babies seen by [facility] providers o Accepting Medicaid o Must be a first-time baby or sibling of current patient . [place], [address], [place], [place]  [address] o [phone]   Fax - [phone]  [place] [phone] & [phone]) . [place], [place]; [place], [place]; Benjamine Mola, MD; [place], [place]; Harrell Lark, MD o [address] [place], [place] [address] o [phone] o Mon-Thur 8:00-7:00, Fri 8:00-5:00, Sat 8:00-12:00, Sun 9:00-12:00 o Babies seen by [facility] providers o Accepting Medicaid . [facility] at [facility], MD; Ruthann Cancer, MD; [facility], MD o [address], [place], [place] [address] o [phone] o Mon-Thur 8:00-5:00 o Babies seen by providers at [facility] o Accepting Medicaid . [facility] [place], MD; [place], MD; Amedeo Plenty, MD; Bobby Rumpf, MD; List, MD; [place]onia Drafts, MD; Ruthann Cancer, MD; Selinda Eon, MD; Audie Box, MD; Jim Like, MD; Christie Nottingham, MD; Hubbard Hartshorn, MD; Modena Nunnery, MD o [address] [place], [place]  [address] o [phone] o Mon-Fri 8:00-5:30, Sat (Oct.-Mar.) 9:00-1:00 o Babies seen by providers at [facility] o Accepting Medicaid o Must fill out new patient packet, available online at [email or web address] . [place] ([facility]) Barnabas Lister, NP; [place]on Kingfisher, NP; Claiborne Billings, NP; Rolla Plate, MD;  [place], [place]; Carola Rhine, MD; Tyron Russell, MD; Delia Chimes, NP o [address], [place], [place] [address] o [phone] o Mon-Thur 8:00-5:30, Fri 8:00-5:00 o Babies seen by providers at [place] [phone]) . [place], [place]; [place], MD; Dennard Schaumann, MD; [place], [place] o [address] [place], [place] [address] o [phone] o Mon-Fri 8:00-5:00 o Babies seen by providers at [place] [place] [phone]) . [place] [facility] [place], [place]; Olen Pel, MD; [place], [place], [place], [place] [address] o [phone] o Mon-Fri 8:00-5:00 o Babies seen by providers at [facility] o Does NOT accept Medicaid o Limited appointment availability, please call early in hospitalization  . Therapist, music at [place], [place]; [place], [address], [place], [place] [address] o [phone] o Mon-Fri 8:00-5:00 o Babies seen by [facility] providers o Does NOT accept Medicaid . [facility] Su Grand, MD; Guy Sandifer, MD; [place], [place]; [place], [place] [address], [place], [place] [address] o [phone] o Mon-Fri 8:00-5:00 o After hours clinic [facility] [place], [place] [address]) [phone] Mon-Fri 5:00-8:00, Sat 12:00-6:00, Sun 10:00-4:00 o Babies seen by [facility] providers o Accepting Medicaid . [place] [facility] o [address], [place], [place]  [address] o [phone]   Fax - [phone]  [place] [phone]) . Therapist, music at [facility], MD o [address] [place], [place], [place] [address] o [phone] o Mon-Fri 8:00-5:00 o Babies seen by [facility] providers o Does NOT accept Medicaid . [place] ([facility] [place]) Bing Neighbors, MD o [address] [place], [place],   [address] o [phone] o Mon-Thur 8:00-7:00, Fri 8:00-5:00, Sat 8:00-12:00 o Babies seen by providers at [facility] o Accepting Medicaid - but does not have vaccinations in office (must be received elsewhere) o Limited availability, please call early in hospitalization  [place] ([address]) . [place], [place] [address] [place] [address] o [phone]  Fax [phone]       Places to have your son circumcised:    [facility] [phone] while you are in hospital  [facility] [phone] $244 by 4 wks  [facility] [phone] $175 by 2 wks  [facility] [phone] $250 by 7 days [facility] [phone] $269 by 4 wks  These prices sometimes change but are roughly what you can expect to pay. Please call and confirm pricing.   Circumcision is considered an elective/non-medically necessary procedure. There are many reasons parents decide to have their sons circumsized. During the first year of life circumcised males have a reduced risk of urinary tract infections but after this year the rates between circumcised males and uncircumcised males are the same.  It is safe to have your son circumcised outside of the hospital and the places above perform them regularly.   Deciding about Circumcision in Baby Boys  (Up-to-date The Basics)  What is circumcision?  Circumcision is a surgery  that removes the skin that covers the tip of the penis, called the "foreskin" Circumcision is usually done when a boy is between 18 and 66 days old. In the [place], circumcision is common. In some other countries, fewer boys are circumcised. Circumcision is a common tradition in some religions.  Should I have my baby boy circumcised?  There is no easy answer. Circumcision has some benefits.  But it also has risks. After talking with your doctor, you will have to decide for yourself what is right for your family.  What are the benefits of circumcision?  Circumcised boys seem to have slightly lower rates of: ?Urinary tract infections ?Swelling of the opening at the tip of the penis Circumcised men seem to have slightly lower rates of: ?Urinary tract infections ?Swelling of the opening at the tip of the penis ?Penis cancer ?HIV and other infections that you catch during sex ?Cervical cancer in the women they have sex with Even so, in the [place], the risks of these problems are small - even in boys and men who have not been circumcised. Plus, boys and men who are not circumcised can reduce these extra risks by: ?Cleaning their penis well ?Using condoms during sex  What are the risks of circumcision?  Risks include: ?Bleeding or infection from the surgery ?Damage to or amputation of the penis ?A chance that the doctor will cut off too much or not enough of the foreskin ?A chance that sex won't feel as good later in life Only about 1 out of every 200 circumcisions leads to problems. There is also a chance that your health insurance won't pay for circumcision.  How is circumcision done in baby boys?  First, the baby gets medicine for pain relief. This might be a cream on the skin or a shot into the base of the penis. Next, the doctor cleans the baby's penis well. Then he or she uses special tools to cut off the foreskin. Finally, the doctor wraps a bandage (called gauze) around the baby's penis. If you have your baby circumcised, his doctor or nurse will give you instructions on how to care for him after the surgery. It is important that you follow those instructions carefully.

## 2018-05-07 ENCOUNTER — Telehealth: Payer: Self-pay | Admitting: General Practice

## 2018-05-07 NOTE — Telephone Encounter (Signed)
Left message on VM for pt to give our office a call back in regards to appt scheduled for 05/09/2018 at 10:30am.

## 2018-05-09 ENCOUNTER — Other Ambulatory Visit: Payer: Self-pay

## 2018-05-09 ENCOUNTER — Encounter: Payer: Self-pay | Admitting: Obstetrics and Gynecology

## 2018-05-09 ENCOUNTER — Ambulatory Visit (INDEPENDENT_AMBULATORY_CARE_PROVIDER_SITE_OTHER): Payer: Medicaid Other | Admitting: Obstetrics and Gynecology

## 2018-05-09 DIAGNOSIS — R102 Pelvic and perineal pain: Secondary | ICD-10-CM | POA: Diagnosis not present

## 2018-05-09 DIAGNOSIS — Z3403 Encounter for supervision of normal first pregnancy, third trimester: Secondary | ICD-10-CM

## 2018-05-09 DIAGNOSIS — O26893 Other specified pregnancy related conditions, third trimester: Secondary | ICD-10-CM | POA: Diagnosis not present

## 2018-05-09 DIAGNOSIS — Z3A34 34 weeks gestation of pregnancy: Secondary | ICD-10-CM | POA: Diagnosis not present

## 2018-05-09 MED ORDER — COMFORT FIT MATERNITY SUPP SM MISC: 1.0000 [IU] | Freq: Every day | 0 refills | Status: DC | PRN

## 2018-05-09 NOTE — Progress Notes (Signed)
   TELEHEALTH VIRTUAL OBSTETRICS VISIT ENCOUNTER NOTE  I connected with Annette Glenn on 05/09/18 at 10:30 AM EDT by telephone at home and verified that I am speaking with the correct person using two identifiers.   I discussed the limitations, risks, security and privacy concerns of performing an evaluation and management service by telephone and the availability of in person appointments. I also discussed with the patient that there may be a patient responsible charge related to this service. The patient expressed understanding and agreed to proceed.  Subjective:  Annette Glenn is a 23 y.o. G1P0 at [redacted]w[redacted]d being followed for ongoing prenatal care.  She is currently monitored for the following issues for this low-risk pregnancy and has Supervision of normal first pregnancy, antepartum on their problem list.  Patient reports backache and pelvic pressure with walking and moving around. Reports fetal movement. Denies any contractions, bleeding or leaking of fluid.   The following portions of the patient's history were reviewed and updated as appropriate: allergies, current medications, past family history, past medical history, past social history, past surgical history and problem list.   Objective:   General:  Alert, oriented and cooperative.   Mental Status: Normal mood and affect perceived. Normal judgment and thought content.  Rest of physical exam deferred due to type of encounter  Assessment and Plan:  Pregnancy: G1P0 at [redacted]w[redacted]d 1. Pelvic pain affecting pregnancy in third trimester, antepartum - Elastic Bandages & Supports (COMFORT FIT MATERNITY SUPP SM) MISC; 1 Units by Does not apply route daily as needed.  Dispense: 1 each; Refill: 0  2. Encounter for supervision of normal first pregnancy in third trimester - Anticipatory guidance given for GBS and cx check nv - Labor planning at nv  Preterm labor symptoms and general obstetric precautions including but not limited to vaginal  bleeding, contractions, leaking of fluid and fetal movement were reviewed in detail with the patient.  I discussed the assessment and treatment plan with the patient. The patient was provided an opportunity to ask questions and all were answered. The patient agreed with the plan and demonstrated an understanding of the instructions. The patient was advised to call back or seek an in-person office evaluation/go to [facility]U at [facility] for any urgent or concerning symptoms. Please refer to After Visit Summary for other counseling recommendations.   Return in about 3 weeks (around 05/30/2018) for Return OB w/GBS.  Future Appointments  Date Time Provider [place]  05/29/2018 10:10 AM Laury Deep, CNM [facility] None  06/05/2018 10:10 AM Laury Deep, CNM [facility] None  06/12/2018 10:10 AM Laury Deep, CNM [facility] None    100% of this 10 minute (from 1030 AM - 1040 AM) telehealth visit was spent discussing her OB care/management  Laury Deep, [place] for [employer], [place]

## 2018-05-22 ENCOUNTER — Encounter: Payer: Medicaid Other | Admitting: Obstetrics and Gynecology

## 2018-05-29 ENCOUNTER — Other Ambulatory Visit: Payer: Self-pay

## 2018-05-29 ENCOUNTER — Other Ambulatory Visit (HOSPITAL_COMMUNITY)
Admission: RE | Admit: 2018-05-29 | Discharge: 2018-05-29 | Disposition: A | Discharge status: Home / Self Care | Payer: Medicaid Other | Source: Ambulatory Visit | Attending: Obstetrics and Gynecology | Admitting: Obstetrics and Gynecology

## 2018-05-29 ENCOUNTER — Encounter: Payer: Self-pay | Admitting: Obstetrics and Gynecology

## 2018-05-29 ENCOUNTER — Ambulatory Visit (INDEPENDENT_AMBULATORY_CARE_PROVIDER_SITE_OTHER): Payer: Medicaid Other | Admitting: Obstetrics and Gynecology

## 2018-05-29 VITALS — BP 124/76 | HR 106 | Temp 98.1°F | Wt 193.6 lb

## 2018-05-29 DIAGNOSIS — O99613 Diseases of the digestive system complicating pregnancy, third trimester: Secondary | ICD-10-CM

## 2018-05-29 DIAGNOSIS — Z3403 Encounter for supervision of normal first pregnancy, third trimester: Secondary | ICD-10-CM | POA: Insufficient documentation

## 2018-05-29 DIAGNOSIS — R8271 Bacteriuria: Secondary | ICD-10-CM

## 2018-05-29 DIAGNOSIS — Z3A36 36 weeks gestation of pregnancy: Secondary | ICD-10-CM

## 2018-05-29 DIAGNOSIS — K5900 Constipation, unspecified: Secondary | ICD-10-CM

## 2018-05-29 DIAGNOSIS — O98513 Other viral diseases complicating pregnancy, third trimester: Secondary | ICD-10-CM

## 2018-05-29 MED ORDER — POLYETHYLENE GLYCOL 3350 17 G PO PACK: 17.0000 g | PACK | Freq: Every day | ORAL | 0 refills | Status: DC

## 2018-05-29 NOTE — Progress Notes (Signed)
   PRENATAL VISIT NOTE  Subjective:  Annette Glenn is a 23 y.o. G1P0 at [redacted]w[redacted]d being seen today for ongoing prenatal care.  She is currently monitored for the following issues for this low-risk pregnancy and has Supervision of normal first pregnancy, antepartum on their problem list.  Patient reports constipation and painful defecation.  Contractions: Not present. Vag. Bleeding: None.  Movement: Present. Denies leaking of fluid.   The following portions of the patient's history were reviewed and updated as appropriate: allergies, current medications, past family history, past medical history, past social history, past surgical history and problem list.   Objective:   Vitals:   05/29/18 1012  BP: 124/76  Pulse: (!) 106  Temp: 98.1 F (36.7 C)  Weight: 193 lb 9.6 oz (87.8 kg)    Fetal Status: Fetal Heart Rate (bpm): 145 Fundal Height: 37 cm Movement: Present  Presentation: Vertex  General:  Alert, oriented and cooperative. Patient is in no acute distress.  Skin: Skin is warm and dry. No rash noted.   Cardiovascular: Normal heart rate noted  Respiratory: Normal respiratory effort, no problems with respiration noted  Abdomen: Soft, gravid, appropriate for gestational age.  Pain/Pressure: Absent     Pelvic: Cervical exam performed Dilation: 1 Effacement (%): 60 Station: -3  Extremities: Normal range of motion.  Edema: None  Mental Status: Normal mood and affect. Normal behavior. Normal judgment and thought content.   Assessment and Plan:  Pregnancy: G1P0 at [redacted]w[redacted]d 1. Encounter for supervision of normal first pregnancy in third trimester - GC/CT sent - Discussed options for constipation -- Increase fiber in diet, increase daily water intake and Rx for Miralax BID sent  2. GBS bacteriuria - Advised will need that she will need to receive abx in labor  Term labor symptoms and general obstetric precautions including but not limited to vaginal bleeding, contractions, leaking of fluid  and fetal movement were reviewed in detail with the patient. Please refer to After Visit Summary for other counseling recommendations.   Return in about 2 weeks (around 06/12/2018) for Return OB - webex/telehealth.  Future Appointments  Date Time Provider [place]  06/05/2018 10:10 AM Laury Deep, CNM [facility] None  06/12/2018 10:10 AM Laury Deep, CNM [facility] None    Laury Deep, CNM

## 2018-05-29 NOTE — Patient Instructions (Addendum)
Please purchase electric Blood Pressure Cuff from [facility]. These cuffs have been tested to be very comparable to the ones we use in our offices. The purchase price should be <$25.00. Please let our office know, if you absolutely cannot afford to purchase a cuff or borrow one from someone. We will then make arrangements for you to get a cuff. We have a limited supply of cuffs for those that have no other resources to obtain a cuff. We encourage you to get registered in Babyscripts so you can enter your blood pressures in that system. If you have any problems getting in Babyscripts, please call the office for help.   Braxton Hicks Contractions Contractions of the uterus can occur throughout pregnancy, but they are not always a sign that you are in labor. You may have practice contractions called Braxton Hicks contractions. These false labor contractions are sometimes confused with true labor. What are [place] contractions? Braxton Hicks contractions are tightening movements that occur in the muscles of the uterus before labor. Unlike true labor contractions, these contractions do not result in opening (dilation) and thinning of the cervix. Toward the end of pregnancy (32-34 weeks), Braxton Hicks contractions can happen more often and may become stronger. These contractions are sometimes difficult to tell apart from true labor because they can be very uncomfortable. You should not feel embarrassed if you go to the hospital with false labor. Sometimes, the only way to tell if you are in true labor is for your health care provider to look for changes in the cervix. The health care provider will do a physical exam and may monitor your contractions. If you are not in true labor, the exam should show that your cervix is not dilating and your water has not broken. If there are no other health problems associated with your pregnancy, it is completely safe for you to be sent home with false labor. You may  continue to have Braxton Hicks contractions until you go into true labor. How to tell the difference between true labor and false labor True labor  Contractions last 30-70 seconds.  Contractions become very regular.  Discomfort is usually felt in the top of the uterus, and it spreads to the lower abdomen and low back.  Contractions do not go away with walking.  Contractions usually become more intense and increase in frequency.  The cervix dilates and gets thinner. False labor  Contractions are usually shorter and not as strong as true labor contractions.  Contractions are usually irregular.  Contractions are often felt in the front of the lower abdomen and in the groin.  Contractions may go away when you walk around or change positions while lying down.  Contractions get weaker and are shorter-lasting as time goes on.  The cervix usually does not dilate or become thin. Follow these instructions at home:   Take over-the-counter and prescription medicines only as told by your health care provider.  Keep up with your usual exercises and follow other instructions from your health care provider.  Eat and drink lightly if you think you are going into labor.  If Braxton Hicks contractions are making you uncomfortable: ? Change your position from lying down or resting to walking, or change from walking to resting. ? Sit and rest in a tub of warm water. ? Drink enough fluid to keep your urine pale yellow. Dehydration may cause these contractions. ? Do slow and deep breathing several times an hour.  Keep all follow-up prenatal visits  as told by your health care provider. This is important. Contact a health care provider if:  You have a fever.  You have continuous pain in your abdomen. Get help right away if:  Your contractions become stronger, more regular, and closer together.  You have fluid leaking or gushing from your vagina.  You pass blood-tinged mucus (bloody  show).  You have bleeding from your vagina.  You have low back pain that you never had before.  You feel your baby's head pushing down and causing pelvic pressure.  Your baby is not moving inside you as much as it used to. Summary  Contractions that occur before labor are called Braxton Hicks contractions, false labor, or practice contractions.  Braxton Hicks contractions are usually shorter, weaker, farther apart, and less regular than true labor contractions. True labor contractions usually become progressively stronger and regular, and they become more frequent.  Manage discomfort from [facility] contractions by changing position, resting in a warm bath, drinking plenty of water, or practicing deep breathing. This information is not intended to replace advice given to you by your health care provider. Make sure you discuss any questions you have with your health care provider. Document Released: 06/14/2016 Document Revised: 11/13/2016 Document Reviewed: 06/14/2016 Elsevier Interactive Patient Education  2019 [employer].

## 2018-06-03 ENCOUNTER — Other Ambulatory Visit: Payer: Self-pay | Admitting: *Deleted

## 2018-06-03 DIAGNOSIS — Z3400 Encounter for supervision of normal first pregnancy, unspecified trimester: Secondary | ICD-10-CM

## 2018-06-03 LAB — CERVICOVAGINAL ANCILLARY ONLY
Chlamydia: NEGATIVE
Neisseria Gonorrhea: NEGATIVE

## 2018-06-03 MED ORDER — PREMIUM AUTOMATIC BP MONITOR DEVI: 0 refills | Status: DC

## 2018-06-03 NOTE — Addendum Note (Signed)
Addended by: Derl Barrow on: 06/03/2018 11:43 AM   Modules accepted: Orders

## 2018-06-03 NOTE — Progress Notes (Addendum)
Enrolled into babyscripts and order BP cuff. Rx faxed to [facility]  Derl Barrow, RN

## 2018-06-05 ENCOUNTER — Encounter: Payer: Medicaid Other | Admitting: Obstetrics and Gynecology

## 2018-06-11 DIAGNOSIS — Z3490 Encounter for supervision of normal pregnancy, unspecified, unspecified trimester: Secondary | ICD-10-CM | POA: Diagnosis not present

## 2018-06-12 ENCOUNTER — Encounter: Payer: Self-pay | Admitting: Obstetrics and Gynecology

## 2018-06-12 ENCOUNTER — Other Ambulatory Visit: Payer: Self-pay

## 2018-06-12 ENCOUNTER — Ambulatory Visit (INDEPENDENT_AMBULATORY_CARE_PROVIDER_SITE_OTHER): Payer: Medicaid Other | Admitting: Obstetrics and Gynecology

## 2018-06-12 VITALS — Wt 191.0 lb

## 2018-06-12 DIAGNOSIS — Z3403 Encounter for supervision of normal first pregnancy, third trimester: Secondary | ICD-10-CM

## 2018-06-12 DIAGNOSIS — Z3A38 38 weeks gestation of pregnancy: Secondary | ICD-10-CM

## 2018-06-12 DIAGNOSIS — R8271 Bacteriuria: Secondary | ICD-10-CM

## 2018-06-12 NOTE — Patient Instructions (Addendum)
Please purchase electric Blood Pressure Cuff from [facility]. These cuffs have been tested to be very comparable to the ones we use in our offices. Please let our office know, if you absolutely cannot afford to purchase a cuff or borrow one from someone. We will then make arrangements for you to get a cuff. We have a limited supply of cuffs for those that have no other resources to obtain a cuff. We encourage you to get registered in Babyscripts so you can enter your blood pressures in that system. If you have any problems getting in Babyscripts, please call the office for help.   We will need to see you in the office on 06/19/2018, if you do not purchase a BP cuff this week. We cannot go that long without knowing your BP.  Fetal Movement Counts Patient Name: ________________________________________________ Patient Due Date: ____________________ What is a fetal movement count?  A fetal movement count is the number of times that you feel your baby move during a certain amount of time. This may also be called a fetal kick count. A fetal movement count is recommended for every pregnant woman. You may be asked to start counting fetal movements as early as week 28 of your pregnancy. Pay attention to when your baby is most active. You may notice your baby's sleep and wake cycles. You may also notice things that make your baby move more. You should do a fetal movement count:  When your baby is normally most active.  At the same time each day. A good time to count movements is while you are resting, after having something to eat and drink. How do I count fetal movements? 1. Find a quiet, comfortable area. Sit, or lie down on your side. 2. Write down the date, the start time and stop time, and the number of movements that you felt between those two times. Take this information with you to your health care visits. 3. For 2 hours, count kicks, flutters, swishes, rolls, and jabs. You should feel at least 10  movements during 2 hours. 4. You may stop counting after you have felt 10 movements. 5. If you do not feel 10 movements in 2 hours, have something to eat and drink. Then, keep resting and counting for 1 hour. If you feel at least 4 movements during that hour, you may stop counting. Contact a health care provider if:  You feel fewer than 4 movements in 2 hours.  Your baby is not moving like he or she usually does. Date: ____________ Start time: ____________ Stop time: ____________ Movements: ____________ Date: ____________ Start time: ____________ Stop time: ____________ Movements: ____________ Date: ____________ Start time: ____________ Stop time: ____________ Movements: ____________ Date: ____________ Start time: ____________ Stop time: ____________ Movements: ____________ Date: ____________ Start time: ____________ Stop time: ____________ Movements: ____________ Date: ____________ Start time: ____________ Stop time: ____________ Movements: ____________ Date: ____________ Start time: ____________ Stop time: ____________ Movements: ____________ Date: ____________ Start time: ____________ Stop time: ____________ Movements: ____________ Date: ____________ Start time: ____________ Stop time: ____________ Movements: ____________ This information is not intended to replace advice given to you by your health care provider. Make sure you discuss any questions you have with your health care provider. Document Released: 02/28/2006 Document Revised: 09/28/2015 Document Reviewed: 03/10/2015 Elsevier Interactive Patient Education  2019 [employer] Inc[employer]   [employer] of the uterus can occur throughout pregnancy, but they are not always a sign that you are in labor. You may have practice contractions called  Braxton Hicks contractions. These false labor contractions are sometimes confused with true labor. What are [place] contractions? Braxton Hicks contractions are  tightening movements that occur in the muscles of the uterus before labor. Unlike true labor contractions, these contractions do not result in opening (dilation) and thinning of the cervix. Toward the end of pregnancy (32-34 weeks), Braxton Hicks contractions can happen more often and may become stronger. These contractions are sometimes difficult to tell apart from true labor because they can be very uncomfortable. You should not feel embarrassed if you go to the hospital with false labor. Sometimes, the only way to tell if you are in true labor is for your health care provider to look for changes in the cervix. The health care provider will do a physical exam and may monitor your contractions. If you are not in true labor, the exam should show that your cervix is not dilating and your water has not broken. If there are no other health problems associated with your pregnancy, it is completely safe for you to be sent home with false labor. You may continue to have Braxton Hicks contractions until you go into true labor. How to tell the difference between true labor and false labor True labor  Contractions last 30-70 seconds.  Contractions become very regular.  Discomfort is usually felt in the top of the uterus, and it spreads to the lower abdomen and low back.  Contractions do not go away with walking.  Contractions usually become more intense and increase in frequency.  The cervix dilates and gets thinner. False labor  Contractions are usually shorter and not as strong as true labor contractions.  Contractions are usually irregular.  Contractions are often felt in the front of the lower abdomen and in the groin.  Contractions may go away when you walk around or change positions while lying down.  Contractions get weaker and are shorter-lasting as time goes on.  The cervix usually does not dilate or become thin. Follow these instructions at home:   Take over-the-counter and  prescription medicines only as told by your health care provider.  Keep up with your usual exercises and follow other instructions from your health care provider.  Eat and drink lightly if you think you are going into labor.  If Braxton Hicks contractions are making you uncomfortable: ? Change your position from lying down or resting to walking, or change from walking to resting. ? Sit and rest in a tub of warm water. ? Drink enough fluid to keep your urine pale yellow. Dehydration may cause these contractions. ? Do slow and deep breathing several times an hour.  Keep all follow-up prenatal visits as told by your health care provider. This is important. Contact a health care provider if:  You have a fever.  You have continuous pain in your abdomen. Get help right away if:  Your contractions become stronger, more regular, and closer together.  You have fluid leaking or gushing from your vagina.  You pass blood-tinged mucus (bloody show).  You have bleeding from your vagina.  You have low back pain that you never had before.  You feel your baby's head pushing down and causing pelvic pressure.  Your baby is not moving inside you as much as it used to. Summary  Contractions that occur before labor are called Braxton Hicks contractions, false labor, or practice contractions.  Braxton Hicks contractions are usually shorter, weaker, farther apart, and less regular than true labor contractions. True labor  contractions usually become progressively stronger and regular, and they become more frequent.  Manage discomfort from [facility] contractions by changing position, resting in a warm bath, drinking plenty of water, or practicing deep breathing. This information is not intended to replace advice given to you by your health care provider. Make sure you discuss any questions you have with your health care provider. Document Released: 06/14/2016 Document Revised: 11/13/2016 Document  Reviewed: 06/14/2016 Elsevier Interactive Patient Education  2019 [employer].

## 2018-06-12 NOTE — Progress Notes (Signed)
   [place] VIRTUAL OBSTETRICS VISIT ENCOUNTER NOTE  I connected with [name] on 06/12/18 at 10:10 AM EDT by Webex at home and verified that I am speaking with the correct person using two identifiers.   I discussed the limitations, risks, security and privacy concerns of performing an evaluation and management service by telephone and the availability of in person appointments. I also discussed with the patient that there may be a patient responsible charge related to this service. The patient expressed understanding and agreed to proceed.  Subjective:  Annette Glenn is a 23 y.o. G1P0 at [redacted]w[redacted]d being followed for ongoing prenatal care.  She is currently monitored for the following issues for this low-risk pregnancy and has Supervision of normal first pregnancy, antepartum on their problem list.  Patient reports no complaints. Reports fetal movement. Denies any contractions, bleeding or leaking of fluid.   The following portions of the patient's history were reviewed and updated as appropriate: allergies, current medications, past family history, past medical history, past social history, past surgical history and problem list.   Objective:   General:  Alert, oriented and cooperative.   Mental Status: Normal mood and affect perceived. Normal judgment and thought content.  Rest of physical exam deferred due to type of encounter *Patient does not have BP cuff at home Assessment and Plan:  Pregnancy: G1P0 at [redacted]w[redacted]d 1. Encounter for supervision of normal first pregnancy in third trimester - Information provided via My Chart on fetal kick count and false labor  2. GBS bacteriuria - Advised will need abx in labor  Term labor symptoms and general obstetric precautions including but not limited to vaginal bleeding, contractions, leaking of fluid and fetal movement were reviewed in detail with the patient.  I discussed the assessment and treatment plan with the patient. The patient was  provided an opportunity to ask questions and all were answered. The patient agreed with the plan and demonstrated an understanding of the instructions. The patient was advised to call back or seek an in-person office evaluation/go to MAU at [facility] for any urgent or concerning symptoms. Please refer to After Visit Summary for other counseling recommendations.   I provided 10 minutes of non-face-to-face time during this encounter. There was 5 minutes of chart review time spent prior to this encounter. Total time spent = 15 minutes.   Return in about 1 week (around 06/19/2018) for Return OB visit.  Future Appointments  Date Time Provider [place]  06/26/2018  9:50 AM Laury Deep, CNM [facility] None    Laury Deep, [place] for [employer], [place]

## 2018-06-19 ENCOUNTER — Telehealth (HOSPITAL_COMMUNITY): Payer: Self-pay | Admitting: *Deleted

## 2018-06-19 ENCOUNTER — Ambulatory Visit (INDEPENDENT_AMBULATORY_CARE_PROVIDER_SITE_OTHER): Payer: Medicaid Other | Admitting: Obstetrics and Gynecology

## 2018-06-19 ENCOUNTER — Other Ambulatory Visit: Payer: Self-pay | Admitting: Obstetrics and Gynecology

## 2018-06-19 ENCOUNTER — Other Ambulatory Visit: Payer: Self-pay

## 2018-06-19 ENCOUNTER — Encounter (HOSPITAL_COMMUNITY): Payer: Self-pay | Admitting: *Deleted

## 2018-06-19 ENCOUNTER — Encounter: Payer: Self-pay | Admitting: Obstetrics and Gynecology

## 2018-06-19 DIAGNOSIS — Z3403 Encounter for supervision of normal first pregnancy, third trimester: Secondary | ICD-10-CM

## 2018-06-19 DIAGNOSIS — Z3400 Encounter for supervision of normal first pregnancy, unspecified trimester: Secondary | ICD-10-CM

## 2018-06-19 DIAGNOSIS — Z3A39 39 weeks gestation of pregnancy: Secondary | ICD-10-CM

## 2018-06-19 NOTE — Progress Notes (Signed)
   PRENATAL VISIT NOTE  Subjective:  Annette Glenn is a 23 y.o. G1P0 at [redacted]w[redacted]d being seen today for ongoing prenatal care.  She is currently monitored for the following issues for this low-risk pregnancy and has Supervision of normal first pregnancy, antepartum on their problem list.  Patient reports no complaints.  Contractions: Not present. Vag. Bleeding: None.  Movement: Present. Denies leaking of fluid.   The following portions of the patient's history were reviewed and updated as appropriate: allergies, current medications, past family history, past medical history, past social history, past surgical history and problem list.   Objective:   Vitals:   06/19/18 0954  BP: 109/69  Pulse: (!) 104  Temp: 97.9 F (36.6 C)  Weight: 194 lb (88 kg)    Fetal Status: Fetal Heart Rate (bpm): 145 Fundal Height: 42 cm Movement: Present  Presentation: Vertex  General:  Alert, oriented and cooperative. Patient is in no acute distress.  Skin: Skin is warm and dry. No rash noted.   Cardiovascular: Normal heart rate noted  Respiratory: Normal respiratory effort, no problems with respiration noted  Abdomen: Soft, gravid, appropriate for gestational age.  Pain/Pressure: Present     Pelvic: Cervical exam performed Dilation: 1 Effacement (%): 60 Station: -3  Extremities: Normal range of motion.  Edema: None  Mental Status: Normal mood and affect. Normal behavior. Normal judgment and thought content.   Assessment and Plan:  Pregnancy: G1P0 at [redacted]w[redacted]d 1. Supervision of normal first pregnancy, antepartum - IOL scheduled for postdates on 06/27/2018 @ 0730 - FB placement on 06/26/2018 during ROB (placed on list) - Discussed induction process - Information provided on induction   Term labor symptoms and general obstetric precautions including but not limited to vaginal bleeding, contractions, leaking of fluid and fetal movement were reviewed in detail with the patient. Please refer to After Visit  Summary for other counseling recommendations.   Return in about 1 week (around 06/26/2018) for Return OB visit - FB insertion.  Future Appointments  Date Time Provider [place]  06/26/2018  4:20 PM Laury Deep, CNM [facility] None  06/27/2018  7:30 AM [facility] [place] None    Laury Deep, CNM

## 2018-06-19 NOTE — Telephone Encounter (Signed)
Preadmission screen  

## 2018-06-19 NOTE — Patient Instructions (Signed)
Labor Induction  Labor induction is when steps are taken to cause a pregnant woman to begin the labor process. Most women go into labor on their own between 37 weeks and 42 weeks of pregnancy. When this does not happen or when there is a medical need for labor to begin, steps may be taken to induce labor. Labor induction causes a pregnant woman's uterus to contract. It also causes the cervix to soften (ripen), open (dilate), and thin out (efface). Usually, labor is not induced before 39 weeks of pregnancy unless there is a medical reason to do so. Your health care provider will determine if labor induction is needed. Before inducing labor, your health care provider will consider a number of factors, including:  Your medical condition and your baby's.  How many weeks along you are in your pregnancy.  How mature your baby's lungs are.  The condition of your cervix.  The position of your baby.  The size of your birth canal. What are some reasons for labor induction? Labor may be induced if:  Your health or your baby's health is at risk.  Your pregnancy is overdue by 1 week or more.  Your water breaks but labor does not start on its own.  There is a low amount of amniotic fluid around your baby. You may also choose (elect) to have labor induced at a certain time. Generally, elective labor induction is done no earlier than 39 weeks of pregnancy. What methods are used for labor induction? Methods used for labor induction include:  Prostaglandin medicine. This medicine starts contractions and causes the cervix to dilate and ripen. It can be taken by mouth (orally) or by being inserted into the vagina (suppository).  Inserting a small, thin tube (catheter) with a balloon into the vagina and then expanding the balloon with water to dilate the cervix.  Stripping the membranes. In this method, your health care provider gently separates amniotic sac tissue from the cervix. This causes the  cervix to stretch, which in turn causes the release of a hormone called progesterone. The hormone causes the uterus to contract. This procedure is often done during an office visit, after which you will be sent home to wait for contractions to begin.  Breaking the water. In this method, your health care provider uses a small instrument to make a small hole in the amniotic sac. This eventually causes the amniotic sac to break. Contractions should begin after a few hours.  Medicine to trigger or strengthen contractions. This medicine is given through an IV that is inserted into a vein in your arm. Except for membrane stripping, which can be done in a clinic, labor induction is done in the hospital so that you and your baby can be carefully monitored. How long does it take for labor to be induced? The length of time it takes to induce labor depends on how ready your body is for labor. Some inductions can take up to 2-3 days, while others may take less than a day. Induction may take longer if:  You are induced early in your pregnancy.  It is your first pregnancy.  Your cervix is not ready. What are some risks associated with labor induction? Some risks associated with labor induction include:  Changes in fetal heart rate, such as being too high, too low, or irregular (erratic).  Failed induction.  Infection in the mother or the baby.  Increased risk of having a cesarean delivery.  Fetal death.  Breaking off (abruption)   high, too low, or irregular (erratic).  · Failed induction.  · Infection in the mother or the baby.  · Increased risk of having a cesarean delivery.  · Fetal death.  · Breaking off (abruption) of the placenta from the uterus (rare).  · Rupture of the uterus (very rare).  When induction is needed for medical reasons, the benefits of induction generally outweigh the risks.  What are some reasons for not inducing labor?  Labor induction should not be done if:  · Your baby does not tolerate contractions.  · You have had previous surgeries on your uterus, such as a myomectomy, removal of fibroids, or a vertical scar from a previous cesarean delivery.  · Your placenta lies  very low in your uterus and blocks the opening of the cervix (placenta previa).  · Your baby is not in a head-down position.  · The umbilical cord drops down into the birth canal in front of the baby.  · There are unusual circumstances, such as the baby being very early (premature).  · You have had more than 2 previous cesarean deliveries.  Summary  · Labor induction is when steps are taken to cause a pregnant woman to begin the labor process.  · Labor induction causes a pregnant woman's uterus to contract. It also causes the cervix to ripen, dilate, and efface.  · Labor is not induced before 39 weeks of pregnancy unless there is a medical reason to do so.  · When induction is needed for medical reasons, the benefits of induction generally outweigh the risks.  This information is not intended to replace advice given to you by your health care provider. Make sure you discuss any questions you have with your health care provider.  Document Released: 06/20/2006 Document Revised: 03/14/2016 Document Reviewed: 03/14/2016  Elsevier Interactive Patient Education © 2019 [employer]        Balloon Catheter Placement for Cervical Ripening  Balloon catheter placement for cervical ripening is a procedure to help your cervix start to soften (ripen) and open (dilate). It is done to prepare your body for labor induction. During this procedure, a thin tube (catheter) is placed through your cervix. A tiny balloon attached to the catheter is inflated with water. Pressure from the balloon is what helps your cervix start to open. Cervical ripening with a balloon catheter can make labor induction shorter and easier.  You may have this procedure if:  · Your cervix is not ready for labor.  · Your health care provider has planned labor induction.  · You are not having twins or multiples.  · Your baby is in the head-down position.  · You do not have any other pregnancy complications that require you to be monitored in the hospital after  balloon catheter placement.  If your health care provider has recommended labor induction to stimulate a vaginal birth, this procedure may be started the day before induction. You will go home with the balloon in place and return to start induction in 12-24 hours. You may have this procedure and stay in the hospital so that your progress can be monitored as well.  Tell a health care provider about:  · Any allergies you have.  · All medicines you are taking, including vitamins, herbs, eye drops, creams, and over-the-counter medicines.  · Any blood disorders you have.  · Any surgeries you have had.  · Any medical conditions you have.  What are the risks?  Generally, this is a safe procedure.   However, problems may occur, including:  · Infection.  · Bleeding.  · Cramping or pain.  · Difficulty passing urine.  · The baby moving from the head-down position to a position with the feet or buttocks down (breech position).  What happens before the procedure?  · Your health care provider may check your baby's heartbeat (fetal monitoring) before the procedure.  · You may be asked to empty your bladder.  What happens during the procedure?    · You will be positioned on the exam table as if you were having a pelvic exam or Pap test.  · Your health care provider may insert a medical instrument into your vagina (speculum) to see your cervix.  · Your cervix may be cleaned with a germ-killing solution.  · The catheter will be inserted through the opening of your cervix.  · A balloon on the end of the catheter will be inflated with sterile water. Some catheters have two balloons, one on each side of the cervix.  · Depending on the type of balloon catheter, the end of the catheter may be left free outside your cervix or taped to your leg.  The procedure may vary among health care providers and hospitals.  What can I expect after the procedure?  · After the procedure, it is common to have:  ? A feeling of pressure inside your  vagina.  ? Light vaginal bleeding (spotting).  · You may have fetal monitoring before going home.  · You may be sent home with the catheter in place and asked to return to start your induction in about 12-24 hours.  Follow these instructions at home:  · Take over-the-counter and prescription medicines only as told by your health care provider.  · Return to your normal activities at home as told by your health care provider. Ask your health care provider what activities are safe for you. Do not leave home until you return for labor induction.  · You may shower at home. Do not take baths, swim, or use a hot tub unless your health care provider approves.  · As your cervix opens, your catheter and balloon may fall out before you return for labor induction. Ask your health care provider what you should do if this happens.  · Keep all follow-up visits as told by your health care provider. This is important. You will need to return to start induction as told by your health care provider.  Contact your health care provider if:  · You have chills or a fever.  · You have constant pain or cramps (not contractions).  · You have trouble passing urine.  · Your water breaks.  · You have vaginal bleeding that is heavier than spotting.  · You have contractions that start to last longer and come closer together (about every 5 minutes).  · The balloon catheter falls out before you return to start your induction.  Summary  · Cervical ripening with placement of a balloon catheter is an outpatient procedure to prepare you for labor induction.  · Cervical ripening with a balloon catheter helps your cervix start to open for birth.  · You will be positioned on the exam table. The catheter will be inserted through the opening of your cervix. A balloon on the end of the catheter will be inflated with water.  · Pressure from the balloon will cause ripening of your cervix. You will go home with the balloon in place and return to start induction

## 2018-06-19 NOTE — Progress Notes (Signed)
IOL orders placed

## 2018-06-20 ENCOUNTER — Other Ambulatory Visit: Payer: Self-pay | Admitting: Advanced Practice Midwife

## 2018-06-23 ENCOUNTER — Encounter: Payer: Self-pay | Admitting: General Practice

## 2018-06-25 ENCOUNTER — Other Ambulatory Visit: Payer: Self-pay

## 2018-06-25 ENCOUNTER — Other Ambulatory Visit (HOSPITAL_COMMUNITY)
Admission: RE | Admit: 2018-06-25 | Discharge: 2018-06-25 | Disposition: A | Discharge status: Home / Self Care | Payer: Medicaid Other | Source: Ambulatory Visit | Attending: Obstetrics & Gynecology | Admitting: Obstetrics & Gynecology

## 2018-06-25 DIAGNOSIS — Z1159 Encounter for screening for other viral diseases: Secondary | ICD-10-CM | POA: Diagnosis not present

## 2018-06-25 NOTE — MAU Note (Signed)
Covid swab collected. Pt tolerated well. Pt asymptomatic 

## 2018-06-26 ENCOUNTER — Ambulatory Visit (INDEPENDENT_AMBULATORY_CARE_PROVIDER_SITE_OTHER): Payer: Medicaid Other | Admitting: Obstetrics and Gynecology

## 2018-06-26 ENCOUNTER — Encounter: Payer: Medicaid Other | Admitting: Obstetrics and Gynecology

## 2018-06-26 ENCOUNTER — Encounter: Payer: Self-pay | Admitting: Obstetrics and Gynecology

## 2018-06-26 ENCOUNTER — Other Ambulatory Visit (HOSPITAL_COMMUNITY)
Admission: RE | Admit: 2018-06-26 | Discharge: 2018-06-26 | Disposition: A | Discharge status: Home / Self Care | Payer: Medicaid Other | Source: Ambulatory Visit | Attending: Obstetrics and Gynecology | Admitting: Obstetrics and Gynecology

## 2018-06-26 ENCOUNTER — Other Ambulatory Visit (HOSPITAL_COMMUNITY): Payer: Self-pay | Admitting: *Deleted

## 2018-06-26 VITALS — BP 116/78 | HR 102 | Temp 98.4°F | Wt 195.0 lb

## 2018-06-26 DIAGNOSIS — O26899 Other specified pregnancy related conditions, unspecified trimester: Secondary | ICD-10-CM | POA: Insufficient documentation

## 2018-06-26 DIAGNOSIS — Z3A40 40 weeks gestation of pregnancy: Secondary | ICD-10-CM

## 2018-06-26 DIAGNOSIS — O480 Post-term pregnancy: Secondary | ICD-10-CM | POA: Insufficient documentation

## 2018-06-26 DIAGNOSIS — N898 Other specified noninflammatory disorders of vagina: Secondary | ICD-10-CM | POA: Diagnosis not present

## 2018-06-26 DIAGNOSIS — Z3403 Encounter for supervision of normal first pregnancy, third trimester: Secondary | ICD-10-CM

## 2018-06-26 DIAGNOSIS — O26893 Other specified pregnancy related conditions, third trimester: Secondary | ICD-10-CM

## 2018-06-26 HISTORY — DX: Post-term pregnancy: O48.0

## 2018-06-26 LAB — NOVEL CORONAVIRUS, NAA (HOSP ORDER, SEND-OUT TO REF LAB; TAT 18-24 HRS): SARS-CoV-2, NAA: NOT DETECTED

## 2018-06-26 NOTE — Progress Notes (Signed)
   PRENATAL VISIT NOTE  Subjective:  Annette Glenn is a 23 y.o. G1P0 at [redacted]w[redacted]d being seen today for ongoing prenatal care.  She is currently monitored for the following issues for this low-risk pregnancy and has Supervision of normal first pregnancy, antepartum and Post term pregnancy over 40 weeks on their problem list.  Patient reports occasional contractions and vaginal irritation.  Contractions: Not present. Vag. Bleeding: None.  Movement: Present. Denies leaking of fluid.   The following portions of the patient's history were reviewed and updated as appropriate: allergies, current medications, past family history, past medical history, past social history, past surgical history and problem list. Problem list updated.  Objective:   Vitals:   06/26/18 1616  BP: 116/78  Pulse: (!) 102  Temp: 98.4 F (36.9 C)  Weight: 195 lb (88.5 kg)    Fetal Status: Fetal Heart Rate (bpm): 143 Fundal Height: 40 cm Movement: Present  Presentation: Vertex  General:  Alert, oriented and cooperative. Patient is in no acute distress.  Skin: Skin is warm and dry. No rash noted.   Cardiovascular: Normal heart rate noted  Respiratory: Normal respiratory effort, no problems with respiration noted  Abdomen: Soft, gravid, appropriate for gestational age.  Pain/Pressure: Present     Pelvic: Cervical exam performed Dilation: 2 Effacement (%): 70 Station: -2  Extremities: Normal range of motion.  Edema: None  Mental Status:  Normal mood and affect. Normal behavior. Normal judgment and thought content.  Procedure: Patient informed of R/B/A of procedure. NST was performed and was reactive prior to procedure. NST:  EFM: Baseline: 135 bpm, Variability: Good {> 6 bpm), Accelerations: Reactive and Decelerations: Absent Toco: irregular UCs with UI noted; inc. UC's after FB insertion  Procedure done to begin ripening of the cervix prior to admission for induction of labor. Appropriate time out taken. The patient  was placed in the lithotomy position and a cervical exam was performed and a finger was used to guide the 26F foley balloon through the internal os of the cervix. Foley Balloon filled with 60cc of normal saline. Plug inserted into end of the foley. Foley placed on tension and taped to medial thigh.  NST:  EFM Baseline: 135 bpm, Variability: Good {> 6 bpm), Accelerations: Reactive and Decelerations: Variable: mild  Toco: irregular UC's; increased after insertion of FB There were no signs of tachysystole or hypertonus. All equipment was removed and accounted for. The patient tolerated the procedure well.  Assessment and Plan:  Pregnancy: G1P0 at [redacted]w[redacted]d 1. Vaginal discharge during pregnancy, antepartum - Cervicovaginal ancillary only( [place])  2. Post term pregnancy over 40 weeks - Discussed possibility of VB, SROM and inc. UC's with FB insertion - S/p Outpatient placement of foley balloon catheter for cervical ripening. Induction of labor scheduled for tomorrow at 0730 am. Reassuring FHR tracing with no concerns at present. Warning signs given to patient to include return to MAU for heavy vaginal bleeding, Rupture of membranes, painful uterine contractions q 5 mins or less, severe abdominal discomfort, decreased fetal movement.   Return in about 6 weeks (around 08/07/2018) for Postpartum Visit.   Future Appointments  Date Time Provider [place]  06/27/2018  7:30 AM [facility] [place], CNM 06/26/2018

## 2018-06-27 ENCOUNTER — Other Ambulatory Visit: Payer: Self-pay

## 2018-06-27 ENCOUNTER — Inpatient Hospital Stay (HOSPITAL_COMMUNITY): Payer: Medicaid Other | Admitting: Anesthesiology

## 2018-06-27 ENCOUNTER — Inpatient Hospital Stay (HOSPITAL_COMMUNITY): Payer: Medicaid Other

## 2018-06-27 ENCOUNTER — Inpatient Hospital Stay (HOSPITAL_COMMUNITY)
Admission: AD | Admit: 2018-06-27 | Discharge: 2018-06-30 | DRG: 807 | Disposition: A | Discharge status: Home / Self Care | Payer: Medicaid Other | Attending: Obstetrics and Gynecology | Admitting: Obstetrics and Gynecology

## 2018-06-27 ENCOUNTER — Encounter (HOSPITAL_COMMUNITY): Payer: Self-pay | Admitting: *Deleted

## 2018-06-27 DIAGNOSIS — Z9221 Personal history of antineoplastic chemotherapy: Secondary | ICD-10-CM | POA: Diagnosis not present

## 2018-06-27 DIAGNOSIS — O99824 Streptococcus B carrier state complicating childbirth: Secondary | ICD-10-CM | POA: Diagnosis present

## 2018-06-27 DIAGNOSIS — O480 Post-term pregnancy: Secondary | ICD-10-CM | POA: Diagnosis not present

## 2018-06-27 DIAGNOSIS — J45909 Unspecified asthma, uncomplicated: Secondary | ICD-10-CM | POA: Diagnosis not present

## 2018-06-27 DIAGNOSIS — Z3A41 41 weeks gestation of pregnancy: Secondary | ICD-10-CM | POA: Diagnosis not present

## 2018-06-27 DIAGNOSIS — Z87891 Personal history of nicotine dependence: Secondary | ICD-10-CM

## 2018-06-27 DIAGNOSIS — O9952 Diseases of the respiratory system complicating childbirth: Secondary | ICD-10-CM | POA: Diagnosis not present

## 2018-06-27 DIAGNOSIS — Z3400 Encounter for supervision of normal first pregnancy, unspecified trimester: Secondary | ICD-10-CM

## 2018-06-27 LAB — TYPE AND SCREEN
ABO/RH(D): O POS
Antibody Screen: NEGATIVE

## 2018-06-27 LAB — CBC
HCT: 36.6 % (ref 36.0–46.0)
Hemoglobin: 12.1 g/dL (ref 12.0–15.0)
MCH: 28.3 pg (ref 26.0–34.0)
MCHC: 33.1 g/dL (ref 30.0–36.0)
MCV: 85.7 fL (ref 80.0–100.0)
Platelets: 188 10*3/uL (ref 150–400)
RBC: 4.27 MIL/uL (ref 3.87–5.11)
RDW: 13.6 % (ref 11.5–15.5)
WBC: 13.1 10*3/uL — ABNORMAL HIGH (ref 4.0–10.5)
nRBC: 0 % (ref 0.0–0.2)

## 2018-06-27 LAB — RPR: RPR Ser Ql: NONREACTIVE

## 2018-06-27 LAB — ABO/RH: ABO/RH(D): O POS

## 2018-06-27 MED ORDER — OXYTOCIN 40 UNITS IN NORMAL SALINE INFUSION - SIMPLE MED
1.0000 m[IU]/min | INTRAVENOUS | Status: DC
  Administered 2018-06-27: 2 m[IU]/min via INTRAVENOUS
  Filled 2018-06-27: qty 1000

## 2018-06-27 MED ORDER — FENTANYL CITRATE (PF) 100 MCG/2ML IJ SOLN
100.0000 ug | INTRAMUSCULAR | Status: DC | PRN
  Administered 2018-06-27 (×4): 100 ug via INTRAVENOUS
  Filled 2018-06-27 (×4): qty 2

## 2018-06-27 MED ORDER — LIDOCAINE HCL (PF) 1 % IJ SOLN
INTRAMUSCULAR | Status: DC | PRN
  Administered 2018-06-27 (×2): 5 mL via EPIDURAL

## 2018-06-27 MED ORDER — SOD CITRATE-CITRIC ACID 500-334 MG/5ML PO SOLN
ORAL | Status: AC
  Filled 2018-06-27: qty 15

## 2018-06-27 MED ORDER — PHENYLEPHRINE 40 MCG/ML (10ML) SYRINGE FOR IV PUSH (FOR BLOOD PRESSURE SUPPORT): 80.0000 ug | PREFILLED_SYRINGE | INTRAVENOUS | Status: DC | PRN

## 2018-06-27 MED ORDER — HYDROXYZINE HCL 50 MG PO TABS: 50.0000 mg | ORAL_TABLET | Freq: Four times a day (QID) | ORAL | Status: DC | PRN

## 2018-06-27 MED ORDER — DIPHENHYDRAMINE HCL 50 MG/ML IJ SOLN: 12.5000 mg | INTRAMUSCULAR | Status: DC | PRN

## 2018-06-27 MED ORDER — ONDANSETRON HCL 4 MG/2ML IJ SOLN
4.0000 mg | Freq: Four times a day (QID) | INTRAMUSCULAR | Status: DC | PRN
  Administered 2018-06-27 – 2018-06-28 (×2): 4 mg via INTRAVENOUS
  Filled 2018-06-27 (×2): qty 2

## 2018-06-27 MED ORDER — EPHEDRINE 5 MG/ML INJ: 10.0000 mg | INTRAVENOUS | Status: DC | PRN

## 2018-06-27 MED ORDER — LIDOCAINE HCL (PF) 1 % IJ SOLN: 30.0000 mL | INTRAMUSCULAR | Status: DC | PRN

## 2018-06-27 MED ORDER — FENTANYL-BUPIVACAINE-NACL 0.5-0.125-0.9 MG/250ML-% EP SOLN
12.0000 mL/h | EPIDURAL | Status: DC | PRN
  Filled 2018-06-27: qty 250

## 2018-06-27 MED ORDER — LACTATED RINGERS IV SOLN
500.0000 mL | Freq: Once | INTRAVENOUS | Status: AC
  Administered 2018-06-27: 500 mL via INTRAVENOUS

## 2018-06-27 MED ORDER — ACETAMINOPHEN 325 MG PO TABS: 650.0000 mg | ORAL_TABLET | ORAL | Status: DC | PRN

## 2018-06-27 MED ORDER — SODIUM CHLORIDE 0.9 % IV SOLN
5.0000 10*6.[IU] | Freq: Once | INTRAVENOUS | Status: AC
  Administered 2018-06-27: 5 10*6.[IU] via INTRAVENOUS
  Filled 2018-06-27: qty 5

## 2018-06-27 MED ORDER — PENICILLIN G 3 MILLION UNITS IVPB - SIMPLE MED
3.0000 10*6.[IU] | INTRAVENOUS | Status: DC
  Administered 2018-06-27 – 2018-06-28 (×5): 3 10*6.[IU] via INTRAVENOUS
  Filled 2018-06-27 (×5): qty 100

## 2018-06-27 MED ORDER — OXYTOCIN 40 UNITS IN NORMAL SALINE INFUSION - SIMPLE MED
2.5000 [IU]/h | INTRAVENOUS | Status: DC
  Administered 2018-06-28: 2.5 [IU]/h via INTRAVENOUS
  Filled 2018-06-27: qty 1000

## 2018-06-27 MED ORDER — LACTATED RINGERS IV SOLN
INTRAVENOUS | Status: DC
  Administered 2018-06-27 (×2): via INTRAVENOUS

## 2018-06-27 MED ORDER — TERBUTALINE SULFATE 1 MG/ML IJ SOLN: 0.2500 mg | Freq: Once | INTRAMUSCULAR | Status: DC | PRN

## 2018-06-27 MED ORDER — SODIUM CHLORIDE (PF) 0.9 % IJ SOLN
INTRAMUSCULAR | Status: DC | PRN
  Administered 2018-06-27: 14 mL/h via EPIDURAL

## 2018-06-27 MED ORDER — PHENYLEPHRINE 40 MCG/ML (10ML) SYRINGE FOR IV PUSH (FOR BLOOD PRESSURE SUPPORT)
80.0000 ug | PREFILLED_SYRINGE | INTRAVENOUS | Status: DC | PRN
  Filled 2018-06-27: qty 10

## 2018-06-27 MED ORDER — LACTATED RINGERS IV SOLN: 500.0000 mL | INTRAVENOUS | Status: DC | PRN

## 2018-06-27 MED ORDER — OXYTOCIN BOLUS FROM INFUSION
500.0000 mL | Freq: Once | INTRAVENOUS | Status: AC
  Administered 2018-06-28: 500 mL via INTRAVENOUS

## 2018-06-27 NOTE — Progress Notes (Signed)
Subjective: Annette Glenn is a 23 y.o. G1P0 at [redacted]w[redacted]d by LMP admitted for induction of labor due to Post dates. Due date 06/20/2018. S/P outpatient FB. On pitocin. Preparing to get epidural.  Objective: BP 131/77   Pulse 96   Temp 97.6 F (36.4 C) (Oral)   Resp 16   Ht 5\' 1"  (1.549 m)   Wt 88.5 kg   LMP 09/13/2017 (Approximate)   SpO2 98%   BMI 36.84 kg/m    FHT:  FHR: 140 bpm, variability: moderate,  accelerations:  Abscent,  decelerations:  Absent UC:   regular, every 1.5-4 minutes SVE:   Dilation: 4.5 Effacement (%): 80 Station: -2 Exam by:: Sunday Corn, CNM Pitocin @ 20 milli-units/min  Labs: Lab Results  Component Value Date   WBC 13.1 (H) 06/27/2018   HGB 12.1 06/27/2018   HCT 36.6 06/27/2018   MCV 85.7 06/27/2018   PLT 188 06/27/2018    Assessment / Plan: Induction of labor due to postterm,  progressing well on pitocin  Labor: Progressing on Pitocin, will continue to increase then AROM Preeclampsia:  n/a Fetal Wellbeing:  Category I Pain Control:  Getting set up for epidural I/D:  n/a Anticipated MOD:  NSVD  Laury Deep, MSN, CNM 06/27/2018, 9:00 PM

## 2018-06-27 NOTE — H&P (Signed)
LABOR AND DELIVERY ADMISSION HISTORY AND PHYSICAL NOTE  Annette Glenn is a 23 y.o. female G1P0 with IUP at [redacted]w[redacted]d by first trimester ultrasound presenting for IOL for postdates.  She reports positive fetal movement. She denies leakage of fluid or vaginal bleeding.  Prenatal History/Complications: PNC at [facility]  Pregnancy complications:  - history of neuroblastoma   Past Medical History: Past Medical History:  Diagnosis Date  . Asthma   . Cancer ([place])   . History of chemotherapy    [id number]--Carboplatin, Cyclophosphamide, Doxorubicin, Etoposide  . 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 Hx of eosinophilia   . 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 Pneumonia     Past Surgical History: Past Surgical History:  Procedure Laterality Date  . Broviac Catheter removal  09/1998  . Femoral Line Placement    . 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 Multiple Laparoscopic liver biopsies    . Open Liver Biopsy     Broviac Catheter placement    Obstetrical History: OB History    Gravida  1   Para      Term      Preterm      AB      Living        SAB      TAB      Ectopic      Multiple      Live Births              Social History: Social History   Socioeconomic History  . Marital status: Single    Spouse name: Not on file  . Number of children: Not on file  . Years of education: [employer]  . Highest education level: 12th grade  Occupational History  . Not on file  Social Needs  . Financial resource strain: Not hard at all  . Food insecurity:    Worry: Never true    Inability: Never true  . Transportation needs:    Medical: No    Non-medical: No  Tobacco Use  . Smoking status: Former Smoker    Packs/day: 0.25    Years: 1.00    Pack years: 0.25    Types: Cigarettes  . Smokeless tobacco: Never Used  Substance and Sexual Activity  . Alcohol use: Never    Frequency: Never  . Drug use: Never  . Sexual activity: Yes     Birth control/protection: None  Lifestyle  . Physical activity:    Days per week: 0 days    Minutes per session: 0 min  . Stress: Not at all  Relationships  . Social connections:    Talks on phone: Three times a week    Gets together: Three times a week    Attends religious service: More than 4 times per year    Active member of club or organization: No    Attends meetings of clubs or organizations: Never    Relationship status: Never married  Other Topics Concern  . Not on file  Social History Narrative  . Not on file    Family History: Family History  Problem Relation Age of Onset  . Diabetes Father     Allergies: No Known Allergies  Medications Prior to Admission  Medication Sig Dispense Refill Last Dose  . Blood Pressure Monitoring (PREMIUM AUTOMATIC BP MONITOR) DEVI Request for an appropriate size cuff (forearm circumference) with automatic BP monitor. To be monitored regularly at home. ICD-10 code: Z34.90  Supervision of normal pregnancy. 1 Device 0   . Elastic Bandages & Supports (COMFORT FIT MATERNITY SUPP SM) MISC 1 Units by Does not apply route daily as needed. (Patient not taking: Reported on 06/12/2018) 1 each 0 Not Taking  . polyethylene glycol (MIRALAX / GLYCOLAX) 17 g packet Take 17 g by mouth daily. 14 each 0   . Prenatal MV-Min-FA-Omega-3 (PRENATAL GUMMIES/DHA & FA) 0.4-32.5 MG CHEW Chew 3 each by mouth daily. 90 tablet 12 Taking     Review of Systems  All systems reviewed and negative except as stated in HPI  Physical Exam Blood pressure 114/72, pulse 100, temperature 99.9 F (37.7 C), temperature source Oral, resp. rate 16, height 5\' 1"  (1.549 m), weight 88.5 kg, last menstrual period 09/13/2017. General appearance: alert, oriented, NAD Lungs: normal respiratory effort Heart: regular rate Abdomen: soft, non-tender; gravid, FH appropriate for GA Extremities: No calf swelling or tenderness Presentation: cephalic Fetal monitoring: 130 bpm, moderate  variability, +acels, no decels  Uterine activity: Irritability  Dilation: 4 Effacement (%): 50 Station: -3 Exam by:: lee  Prenatal labs: ABO, Rh: --/--/O POS (05/15 9798) Antibody: NEG (05/15 0746) Rubella: 2.13 (10/14 0952) RPR: Non Reactive (02/14 0852)  HBsAg: Negative (10/14 0952)  HIV: Non Reactive (02/14 0852)  GC/Chlamydia: Negative  GBS:   Positive urine culture  2-hr GTT: Normal  Genetic screening:  Declined  Anatomy US: Normal   Prenatal Transfer Tool  Maternal Diabetes: No Genetic Screening: Declined Maternal Ultrasounds/Referrals: Normal Fetal Ultrasounds or other Referrals:  None Maternal Substance Abuse:  No Significant Maternal Medications:  None Significant Maternal Lab Results: Lab values include: Group B Strep positive  Results for orders placed or performed during the hospital encounter of 06/27/18 (from the past 24 hour(s))  CBC   Collection Time: 06/27/18  7:46 AM  Result Value Ref Range   WBC 13.1 (H) 4.0 - 10.5 K/uL   RBC 4.27 3.87 - 5.11 MIL/uL   Hemoglobin 12.1 12.0 - 15.0 g/dL   HCT 36.6 36.0 - 46.0 %   MCV 85.7 80.0 - 100.0 fL   MCH 28.3 26.0 - 34.0 pg   MCHC 33.1 30.0 - 36.0 g/dL   RDW 13.6 11.5 - 15.5 %   Platelets 188 150 - 400 K/uL   nRBC 0.0 0.0 - 0.2 %  Type and screen   Collection Time: 06/27/18  7:46 AM  Result Value Ref Range   ABO/RH(D) O POS    Antibody Screen NEG    Sample Expiration      06/30/2018,2359 Performed at [facility], [address] [place], [place] [address]     Patient Active Problem List   Diagnosis Date Noted  . Post term pregnancy over 40 weeks 06/26/2018  . Supervision of normal first pregnancy, antepartum 10/16/2017    Assessment: Annette Glenn is a 23 y.o. G1P0 at [redacted]w[redacted]d here for IOL for postdates.   #Labor: Cervix favorable s/p outpatient FB, start with Pitocin 2x2.  #Pain: Most likely will get epidural.  #FWB: Cat I  #ID:  GBS+, PCN  #MOF: Breast and bottle  #MOC:OCPs  #Circ:   Outpatient   Melina Schools 06/27/2018, 9:05 AM

## 2018-06-27 NOTE — Anesthesia Procedure Notes (Signed)
Epidural Patient location during procedure: OB Start time: 06/27/2018 9:10 PM End time: 06/27/2018 9:16 PM  Staffing Anesthesiologist: Effie Berkshire, MD Performed: anesthesiologist   Preanesthetic Checklist Completed: patient identified, site marked, surgical consent, pre-op evaluation, timeout performed, IV checked, risks and benefits discussed and monitors and equipment checked  Epidural Patient position: sitting Prep: ChloraPrep Patient monitoring: heart rate, continuous pulse ox and blood pressure Approach: midline Location: L3-L4 Injection technique: LOR saline  Needle:  Needle type: Tuohy  Needle gauge: 17 G Needle length: 9 cm Catheter type: closed end flexible Catheter size: 20 Guage Test dose: negative and 1.5% lidocaine  Assessment Events: blood not aspirated, injection not painful, no injection resistance and no paresthesia  Additional Notes LOR @ 5.5  Patient identified. Risks/Benefits/Options discussed with patient including but not limited to bleeding, infection, nerve damage, paralysis, failed block, incomplete pain control, headache, blood pressure changes, nausea, vomiting, reactions to medications, itching and postpartum back pain. Confirmed with bedside nurse the patient's most recent platelet count. Confirmed with patient that they are not currently taking any anticoagulation, have any bleeding history or any family history of bleeding disorders. Patient expressed understanding and wished to proceed. All questions were answered. Sterile technique was used throughout the entire procedure. Please see nursing notes for vital signs. Test dose was given through epidural catheter and negative prior to continuing to dose epidural or start infusion. Warning signs of high block given to the patient including shortness of breath, tingling/numbness in hands, complete motor block, or any concerning symptoms with instructions to call for help. Patient was given instructions  on fall risk and not to get out of bed. All questions and concerns addressed with instructions to call with any issues or inadequate analgesia.    Reason for block:procedure for pain

## 2018-06-27 NOTE — Anesthesia Preprocedure Evaluation (Signed)
Anesthesia Evaluation  Patient identified by MRN, date of birth, ID band Patient awake    Reviewed: Allergy & Precautions, Patient's Chart, lab work & pertinent test results  Airway Mallampati: II       Dental no notable dental hx.    Pulmonary asthma , former smoker,    Pulmonary exam normal        Cardiovascular Normal cardiovascular exam     Neuro/Psych negative neurological ROS     GI/Hepatic negative GI ROS, Neg liver ROS,   Endo/Other  negative endocrine ROS  Renal/GU negative Renal ROS     Musculoskeletal   Abdominal   Peds  Hematology   Anesthesia Other Findings   Reproductive/Obstetrics (+) Pregnancy                             Anesthesia Physical Anesthesia Plan  ASA: II  Anesthesia Plan: Epidural   Post-op Pain Management:    Induction:   PONV Risk Score and Plan:   Airway Management Planned:   Additional Equipment: None  Intra-op Plan:   Post-operative Plan:   Informed Consent: I have reviewed the patients History and Physical, chart, labs and discussed the procedure including the risks, benefits and alternatives for the proposed anesthesia with the patient or authorized representative who has indicated his/her understanding and acceptance.       Plan Discussed with:   Anesthesia Plan Comments: (Lab Results      Component                Value               Date                      WBC                      13.1 (H)            06/27/2018                HGB                      12.1                06/27/2018                HCT                      36.6                06/27/2018                MCV                      85.7                06/27/2018                PLT                      188                 06/27/2018           )        Anesthesia Quick Evaluation

## 2018-06-28 ENCOUNTER — Encounter (HOSPITAL_COMMUNITY): Payer: Self-pay | Admitting: *Deleted

## 2018-06-28 DIAGNOSIS — O480 Post-term pregnancy: Secondary | ICD-10-CM

## 2018-06-28 DIAGNOSIS — Z3A41 41 weeks gestation of pregnancy: Secondary | ICD-10-CM

## 2018-06-28 MED ORDER — BENZOCAINE-MENTHOL 20-0.5 % EX AERO
1.0000 "application " | INHALATION_SPRAY | CUTANEOUS | Status: DC | PRN
  Administered 2018-06-28: 1 via TOPICAL
  Filled 2018-06-28: qty 56

## 2018-06-28 MED ORDER — SENNOSIDES-DOCUSATE SODIUM 8.6-50 MG PO TABS
2.0000 | ORAL_TABLET | ORAL | Status: DC
  Administered 2018-06-28 – 2018-06-29 (×2): 2 via ORAL
  Filled 2018-06-28 (×2): qty 2

## 2018-06-28 MED ORDER — DIBUCAINE (PERIANAL) 1 % EX OINT: 1.0000 "application " | TOPICAL_OINTMENT | CUTANEOUS | Status: DC | PRN

## 2018-06-28 MED ORDER — ONDANSETRON HCL 4 MG PO TABS: 4.0000 mg | ORAL_TABLET | ORAL | Status: DC | PRN

## 2018-06-28 MED ORDER — ACETAMINOPHEN 325 MG PO TABS: 650.0000 mg | ORAL_TABLET | ORAL | Status: DC | PRN

## 2018-06-28 MED ORDER — PRENATAL MULTIVITAMIN CH
1.0000 | ORAL_TABLET | Freq: Every day | ORAL | Status: DC
  Administered 2018-06-28 – 2018-06-30 (×3): 1 via ORAL
  Filled 2018-06-28 (×3): qty 1

## 2018-06-28 MED ORDER — COCONUT OIL OIL: 1.0000 "application " | TOPICAL_OIL | Status: DC | PRN

## 2018-06-28 MED ORDER — DIPHENHYDRAMINE HCL 25 MG PO CAPS: 25.0000 mg | ORAL_CAPSULE | Freq: Four times a day (QID) | ORAL | Status: DC | PRN

## 2018-06-28 MED ORDER — ZOLPIDEM TARTRATE 5 MG PO TABS: 5.0000 mg | ORAL_TABLET | Freq: Every evening | ORAL | Status: DC | PRN

## 2018-06-28 MED ORDER — WITCH HAZEL-GLYCERIN EX PADS: 1.0000 "application " | MEDICATED_PAD | CUTANEOUS | Status: DC | PRN

## 2018-06-28 MED ORDER — SIMETHICONE 80 MG PO CHEW: 80.0000 mg | CHEWABLE_TABLET | ORAL | Status: DC | PRN

## 2018-06-28 MED ORDER — IBUPROFEN 600 MG PO TABS
600.0000 mg | ORAL_TABLET | Freq: Four times a day (QID) | ORAL | Status: DC
  Administered 2018-06-28 – 2018-06-30 (×9): 600 mg via ORAL
  Filled 2018-06-28 (×9): qty 1

## 2018-06-28 MED ORDER — TETANUS-DIPHTH-ACELL PERTUSSIS 5-2.5-18.5 LF-MCG/0.5 IM SUSP: 0.5000 mL | Freq: Once | INTRAMUSCULAR | Status: DC

## 2018-06-28 MED ORDER — ONDANSETRON HCL 4 MG/2ML IJ SOLN: 4.0000 mg | INTRAMUSCULAR | Status: DC | PRN

## 2018-06-28 NOTE — Anesthesia Postprocedure Evaluation (Signed)
Anesthesia Post Note  Patient: Annette Glenn  Procedure(s) Performed: AN AD HOC LABOR EPIDURAL     Patient location during evaluation: Mother Baby Anesthesia Type: Epidural Level of consciousness: awake and alert Pain management: pain level controlled Vital Signs Assessment: post-procedure vital signs reviewed and stable Respiratory status: spontaneous breathing, nonlabored ventilation and respiratory function stable Cardiovascular status: stable Postop Assessment: no headache, no backache, epidural receding, no apparent nausea or vomiting, patient able to bend at knees, able to ambulate and adequate PO intake Anesthetic complications: no    Last Vitals:  Vitals:   06/28/18 0810 06/28/18 0910  BP: 129/74 118/79  Pulse: (!) 115 (!) 107  Resp: 20 18  Temp: 37.7 C 36.9 C  SpO2: 98% 99%    Last Pain:  Vitals:   06/28/18 0910  TempSrc: Oral  PainSc:    Pain Goal:                   Jabier Mutton

## 2018-06-28 NOTE — Lactation Note (Signed)
This note was copied from a baby's chart. Lactation Consultation Note  [name] OACZY'S Date: 06/28/2018 Reason for consult: Initial assessment;Primapara;1st time breastfeeding;Term  82 hours old FT female who is being exclusively BF by his mother, she's a P1. Mom didn't take any BF classes during the pregnancy and BF is really new to her. She came as [place]/formula but hasn't requested any yet, she's been getting help with BF but baby has been really sleepy lately. Reviewed hand expression with mom and noticed her breast are remarkably asymmetrical. Her right breast is bigger but her tissue there is non-compressible. Very little colostrum obtained from that side. Her left breast is smaller but her tissue is compressible and able to get several droplets of colostrum on that side unlike the other.  Offered assistance with latch and mom agreed to wake baby up to feed. LC took baby STS to mom's left breast in football position but baby barely awake, very sleepy, only did a few sucks and then fell asleep at the breast. LC did some hand expression and fed baby a few drops of colostrum, very little sucking on gloved finger, mostly biting. Documented attempt in Flowsheets. Asked mom to call for assistance when needed. Reviewed normal newborn behavior, cluster feeding and feeding cues.  Feeding plan:   1. Encouraged mom to feed baby STS 8-12 times/24 hours or sooner if feeding cues are present 2. Hand expression and spoon feeding were also encouraged  BF brochure, BF resources and feeding diary were reviewed. Parents reported all questions and concerns were answered, they're both aware of [facility] OP services and will call PRN.  Maternal Data Formula Feeding for Exclusion: Yes Reason for exclusion: Mother's choice to formula and breast feed on admission Has patient been taught Hand Expression?: Yes Does the patient have breastfeeding experience prior to this delivery?:  No  Feeding Feeding Type: Breast Fed   Interventions Interventions: Breast feeding basics reviewed;Assisted with latch;Skin to skin;Breast massage;Hand express;Breast compression;Adjust position;Support pillows;Coconut oil  Lactation Tools Discussed/Used WIC Program: No   Consult Status Consult Status: Follow-up Date: 06/29/18 Follow-up type: In-patient    [place] 06/28/2018, 4:09 PM

## 2018-06-28 NOTE — Discharge Summary (Signed)
Postpartum Discharge Summary     [name] DOB: 11/13/1995 MRN: [id number]  Date of admission: 06/27/2018 Delivering Provider: Laury Deep   Date of discharge: 06/29/2018  Admitting diagnosis: pregnancy Intrauterine pregnancy: [redacted]w[redacted]d     Secondary diagnosis:  Active Problems:   Post term pregnancy over 40 weeks  Additional problems: none     Discharge diagnosis: Term Pregnancy Delivered                                                                                                Post partum procedures:none  Augmentation: Pitocin and Foley Balloon  Complications: None  Hospital course:  Induction of Labor With Vaginal Delivery   23 y.o. yo G1P0 at [redacted]w[redacted]d was admitted to the hospital 06/27/2018 for induction of labor.  Indication for induction: Postdates.  Patient had an uncomplicated labor course as follows: Membrane Rupture Time/Date: 2:01 AM ,06/28/2018   Intrapartum Procedures: Episiotomy: None [1]                                         Lacerations:  1st degree [2];Labial [10]  Patient had delivery of a Viable infant.  Information for the patient's newborn:  Teyah, Rossy [[id number]]  Delivery Method: Vaginal, Spontaneous(Filed from Delivery Summary)   06/28/2018  Details of delivery can be found in separate delivery note.  Patient had a routine postpartum course. Patient is discharged home 06/29/18.  Magnesium Sulfate received: No BMZ received: No  Physical exam  Vitals:   06/28/18 1320 06/28/18 1815 06/28/18 2155 06/29/18 0540  BP: 116/68 122/81 125/87 117/66  Pulse: (!) 115 100 (!) 101 84  Resp: 20 18 18 16   Temp: 99.8 F (37.7 C) 97.9 F (36.6 C) 98.8 F (37.1 C) 98.1 F (36.7 C)  TempSrc: Oral  Oral Oral  SpO2: 100% 100% 100% 100%  Weight:      Height:       General: alert, cooperative and no distress Lochia: appropriate Uterine Fundus: firm Incision: N/A DVT Evaluation: No evidence of DVT seen on physical  exam. Labs: Lab Results  Component Value Date   WBC 13.1 (H) 06/27/2018   HGB 12.1 06/27/2018   HCT 36.6 06/27/2018   MCV 85.7 06/27/2018   PLT 188 06/27/2018   CMP 08/01/2006  Total Protein 7.5  Total Bilirubin 0.6  Alkaline Phos 211  AST 22  ALT 23    Discharge instruction: per After Visit Summary and "Baby and Me Booklet".  After visit meds:  Allergies as of 06/29/2018   No Known Allergies     Medication List    TAKE these medications   Comfort Fit Maternity Supp Sm Misc 1 Units by Does not apply route daily as needed.   ibuprofen 800 MG tablet Commonly known as:  ADVIL Take 1 tablet (800 mg total) by mouth every 8 (eight) hours as needed.   polyethylene glycol 17 g packet Commonly known as:  MIRALAX / GLYCOLAX Take 17 g by mouth daily.  Premium Automatic BP Monitor Devi Request for an appropriate size cuff (forearm circumference) with automatic BP monitor. To be monitored regularly at home. ICD-10 code: Z34.90 Supervision of normal pregnancy.   Prenatal Gummies/DHA & FA 0.4-32.5 MG Chew Chew 3 each by mouth daily.       Diet: routine diet  Activity: Advance as tolerated. Pelvic rest for 6 weeks.   Outpatient follow up:6 weeks Follow up Appt:No future appointments. Follow up Visit:   Please schedule this patient for Postpartum visit in: 6 weeks with the following provider: Any provider For C/S patients schedule nurse incision check in weeks 2 weeks: no Low risk pregnancy complicated by: none Delivery mode:  SVD Anticipated Birth Control:  POPs PP Procedures needed: none  Schedule Integrated BH visit: no   Newborn Data: Live born female "Cindee Salt" Birth Weight:   APGAR: 55, 9  Newborn Delivery   Birth date/time:  06/28/2018 06:31:00 Delivery type:  Vaginal, Spontaneous     Baby Feeding: Bottle and Breast Disposition:home with mother  Marcille Buffy DNP, CNM  06/29/18  10:41 AM

## 2018-06-28 NOTE — Progress Notes (Signed)
Subjective: Annette Glenn is a 23 y.o. G1P0 at [redacted]w[redacted]d by LMP admitted for induction of labor due to Post dates. Due date 06/20/2018. S/P epidural and pain well-controlled. Patient able to nap off and on.  Objective: BP (!) 102/59   Pulse (!) 112   Temp 97.6 F (36.4 C) (Oral)   Resp 16   Ht 5\' 1"  (1.549 m)   Wt 88.5 kg   LMP 09/13/2017 (Approximate)   SpO2 99%   BMI 36.84 kg/m    FHT:  FHR: 135 bpm, variability: moderate,  accelerations:  Present,  decelerations:  Absent UC:   regular, every 1-3.5 minutes SVE:   Dilation: 5.5 Effacement (%): 80 Station: -2 Exam by: Annette Glenn, CNM  Labs: Lab Results  Component Value Date   WBC 13.1 (H) 06/27/2018   HGB 12.1 06/27/2018   HCT 36.6 06/27/2018   MCV 85.7 06/27/2018   PLT 188 06/27/2018    Assessment / Plan: Induction of labor due to postterm,  progressing well on pitocin  Labor: Progressing on Pitocin, will continue to increase then AROM Preeclampsia:  n/a Fetal Wellbeing:  Category I Pain Control:  Epidural I/D:  n/a Anticipated MOD:  NSVD  Annette Glenn 06/28/2018, 1:00 AM

## 2018-06-29 MED ORDER — IBUPROFEN 800 MG PO TABS: 800.0000 mg | ORAL_TABLET | Freq: Three times a day (TID) | ORAL | 0 refills | Status: DC | PRN

## 2018-06-30 ENCOUNTER — Encounter: Payer: Self-pay | Admitting: General Practice

## 2018-06-30 LAB — CERVICOVAGINAL ANCILLARY ONLY
Bacterial vaginitis: POSITIVE — AB
Candida vaginitis: POSITIVE — AB
Chlamydia: NEGATIVE
Neisseria Gonorrhea: NEGATIVE
Trichomonas: NEGATIVE

## 2018-06-30 NOTE — Discharge Summary (Signed)
Postpartum Discharge Summary     [name] DOB: [birth year] MRN: [id number]  Date of admission: 06/27/2018 Delivering Provider: Laury Deep   Date of discharge: 06/30/2018  Admitting diagnosis: pregnancy Intrauterine pregnancy: [redacted]w[redacted]d     Secondary diagnosis:  Active Problems:   Post term pregnancy over 40 weeks  Additional problems: none     Discharge diagnosis: Term Pregnancy Delivered                                                                                                Post partum procedures:none  Augmentation: Pitocin and Foley Balloon  Complications: None  Hospital course:  Induction of Labor With Vaginal Delivery   23 y.o. yo G1P0 at [redacted]w[redacted]d was admitted to the hospital 06/27/2018 for induction of labor.  Indication for induction: Postdates.  Patient had an uncomplicated labor course as follows: Membrane Rupture Time/Date: 2:01 AM ,06/28/2018   Intrapartum Procedures: Episiotomy: None [1]                                         Lacerations:  1st degree [2];Labial [10]  Patient had delivery of a Viable infant.  Information for the patient's newborn:  Felice, Hope [[id number]]  Delivery Method: Vaginal, Spontaneous(Filed from Delivery Summary)   06/28/2018  Details of delivery can be found in separate delivery note.  Patient had a routine postpartum course. Patient is discharged home 06/30/18.  Magnesium Sulfate received: No BMZ received: No  Physical exam  Vitals:   06/29/18 0540 06/29/18 1410 06/29/18 2200 06/30/18 0513  BP: 117/66 101/67 118/81 121/84  Pulse: 84 84 100 (!) 103  Resp: 16 18 18 15   Temp: 98.1 F (36.7 C) 98.3 F (36.8 C) 98.7 F (37.1 C)   TempSrc: Oral Oral Oral Oral  SpO2: 100% 100% 100% 100%  Weight:      Height:       General: alert, cooperative and no distress Lochia: appropriate Uterine Fundus: firm Incision: N/A DVT Evaluation: No evidence of DVT seen on physical exam. Labs: Lab Results   Component Value Date   WBC 13.1 (H) 06/27/2018   HGB 12.1 06/27/2018   HCT 36.6 06/27/2018   MCV 85.7 06/27/2018   PLT 188 06/27/2018   CMP 08/01/2006  Total Protein 7.5  Total Bilirubin 0.6  Alkaline Phos 211  AST 22  ALT 23    Discharge instruction: per After Visit Summary and "Baby and Me Booklet".  After visit meds:  Allergies as of 06/30/2018   No Known Allergies     Medication List    TAKE these medications   Comfort Fit Maternity Supp Sm Misc 1 Units by Does not apply route daily as needed.   ibuprofen 800 MG tablet Commonly known as:  ADVIL Take 1 tablet (800 mg total) by mouth every 8 (eight) hours as needed.   polyethylene glycol 17 g packet Commonly known as:  MIRALAX / GLYCOLAX Take 17 g by mouth daily.   Premium Automatic  BP Monitor Devi Request for an appropriate size cuff (forearm circumference) with automatic BP monitor. To be monitored regularly at home. ICD-10 code: Z34.90 Supervision of normal pregnancy.   Prenatal Gummies/DHA & FA 0.4-32.5 MG Chew Chew 3 each by mouth daily.       Diet: routine diet  Activity: Advance as tolerated. Pelvic rest for 6 weeks.   Outpatient follow up:6 weeks Follow up Appt: Future Appointments  Date Time Provider [place]  08/06/2018  1:30 PM Laury Deep, CNM [facility] None   Follow up Visit: Follow-up Information    [facility] - [place].   Why:  Mom is making appt. Contact information: Fax [phone]           Please schedule this patient for Postpartum visit in: 6 weeks with the following provider: Any provider For C/S patients schedule nurse incision check in weeks 2 weeks: no Low risk pregnancy complicated by: none Delivery mode:  SVD Anticipated Birth Control:  POPs PP Procedures needed: none  Schedule Integrated BH visit: no   Newborn Data: Live born female "Cindee Salt" Birth Weight:   APGAR: 57, 9  Newborn Delivery   Birth date/time:  06/28/2018 06:31:00 Delivery type:  Vaginal,  Spontaneous     Baby Feeding: Bottle Disposition:home with mother  Patient was not DC home yesterday due to trouble with infant feeding. She is going bottles only now.   Marcille Buffy DNP, CNM  06/30/18  10:18 AM

## 2018-06-30 NOTE — Lactation Note (Signed)
This note was copied from a baby's chart. Lactation Consultation Note  [name] NOTRR'N Date: 06/30/2018 Reason for consult: Follow-up assessment;1st time breastfeeding;Primapara;Term;Infant weight loss(3 % WEIGHT LOSS , SEE lc NOTE )  Per mom has thinking she may change to formula feeding but may try to continue to work on the breast feeding.  LC explored some options with mom and mentioned its important to take the baby to the breast calmly and  Since the baby has gotten use to the bottle probably after breast massage, hand express, pre-pump with hand pump  And a appetizer of EBM or formula work on latching. If still not successful feed the baby is feeding and re-latch afterwards.  LC instructed mom on the use of hand pump. Sore nipple and engorgement prevention and tx reviewed.  Per mom active with [facility] - LC provided phone number and recommended calling  [name] and check into  A DEBP loaner. Also mentioned to mom if she finds she is going to breast feed and desires to F/U for [name] O/P appt to call  After milk comes in.  Per mom has several breast changes with pregnancy.  Mom aware of the [facility] resources.    Maternal Data    Feeding Feeding Type: (LC unable to assess feeding baby sound asleep fed at 0930 ) Nipple Type: Slow - flow  LATCH Score                   Interventions Interventions: Breast feeding basics reviewed;Hand pump  Lactation Tools Discussed/Used Tools: Pump Breast pump type: Manual WIC Program: Yes(Per mom Active with [facility] ) Pump Review: Setup, frequency, and cleaning;Milk Storage Initiated by:: MAI  Date initiated:: 06/30/18   Consult Status Consult Status: Complete(mom aware of the [facility] O/P services ) Date: 06/30/18    Myer Haff 06/30/2018, 12:36 PM

## 2018-07-01 ENCOUNTER — Telehealth: Payer: Self-pay | Admitting: *Deleted

## 2018-07-01 DIAGNOSIS — B9689 Other specified bacterial agents as the cause of diseases classified elsewhere: Secondary | ICD-10-CM

## 2018-07-01 DIAGNOSIS — B373 Candidiasis of vulva and vagina: Secondary | ICD-10-CM

## 2018-07-01 DIAGNOSIS — B3731 Acute candidiasis of vulva and vagina: Secondary | ICD-10-CM

## 2018-07-01 MED ORDER — FLUCONAZOLE 150 MG PO TABS: 150.0000 mg | ORAL_TABLET | Freq: Once | ORAL | 1 refills | Status: AC

## 2018-07-01 MED ORDER — METRONIDAZOLE 500 MG PO TABS: 500.0000 mg | ORAL_TABLET | Freq: Two times a day (BID) | ORAL | 0 refills | Status: DC

## 2018-07-01 NOTE — Telephone Encounter (Signed)
Patient was advised of +BV and yeast from vaginal culture. Pt opted for treatment. Medication sent to pharmacy.  Derl Barrow, RN

## 2018-07-01 NOTE — Telephone Encounter (Signed)
-----   Message from Laury Deep, [place] sent at 06/30/2018  5:55 PM EDT ----- She was just d/c'd this AM. Please call her to see if she feels she needs to be treated. If so, tx for BV first then yeast after completion. NO VAGINAL CREAMS

## 2018-08-06 ENCOUNTER — Telehealth: Payer: Medicaid Other | Admitting: Obstetrics and Gynecology

## 2018-08-06 ENCOUNTER — Encounter: Payer: Self-pay | Admitting: General Practice

## 2018-08-20 ENCOUNTER — Other Ambulatory Visit: Payer: Self-pay

## 2018-08-20 ENCOUNTER — Telehealth (INDEPENDENT_AMBULATORY_CARE_PROVIDER_SITE_OTHER): Payer: Medicaid Other | Admitting: Obstetrics and Gynecology

## 2018-08-20 ENCOUNTER — Encounter: Payer: Self-pay | Admitting: Obstetrics and Gynecology

## 2018-08-20 DIAGNOSIS — Z1389 Encounter for screening for other disorder: Secondary | ICD-10-CM | POA: Diagnosis not present

## 2018-08-20 DIAGNOSIS — F53 Postpartum depression: Secondary | ICD-10-CM

## 2018-08-20 DIAGNOSIS — Z30011 Encounter for initial prescription of contraceptive pills: Secondary | ICD-10-CM

## 2018-08-20 MED ORDER — NORETHINDRONE 0.35 MG PO TABS: 1.0000 | ORAL_TABLET | Freq: Every day | ORAL | 6 refills | Status: DC

## 2018-08-20 NOTE — Progress Notes (Signed)
MY CHART VIDEO VIRTUAL POSTPARTUM VISIT ENCOUNTER NOTE  I connected with Annette Glenn on 08/20/18 at  9:10 AM EDT by My Chart video at home and verified that I am speaking with the correct person using two identifiers.   I discussed the limitations, risks, security and privacy concerns of performing an evaluation and management service by My Chart video and the availability of in person appointments. I also discussed with the patient that there may be a patient responsible charge related to this service. The patient expressed understanding and agreed to proceed.  History:  Annette Glenn is a 23 y.o. G92P1001 female who presents for a postpartum visit. She is 8 weeks postpartum following a vaginal delivery. I have fully reviewed the prenatal and intrapartum course. The delivery was at 41.1 gestational weeks.  Anesthesia: epidural. Postpartum course has been uncomplicated. Baby's course has been uncomplicated. Baby is feeding by formula: Jerlyn Ly Start Gentle. Bleeding: no. She reports her bleeding stopped about week 4 PP, but then she "bled heavily for 1.5 wks in week 5. No bleeding since then. Bowel function is normal. Bladder function is normal. Patient is currently sexually active; last sex 08/18/2018; unprotected. Contraception method choice is pills. Postpartum depression screening: negative: 8. At risk for developing PPD.        Past Medical History:  Diagnosis Date  . Asthma   . Cancer ([place])   . History of chemotherapy    [id number]--Carboplatin, Cyclophosphamide, Doxorubicin, Etoposide  . 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 Hx of eosinophilia   . 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 Pneumonia    Past Surgical History:  Procedure Laterality Date  . Broviac Catheter removal  09/1998  . Femoral Line Placement    . 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 Multiple Laparoscopic liver biopsies    . Open Liver Biopsy     Broviac Catheter placement   The  following portions of the patient's history were reviewed and updated as appropriate: allergies, current medications, past family history, past medical history, past social history, past surgical history and problem list.   Health Maintenance:  N/A d/t age  Review of Systems:  Pertinent items noted in HPI and remainder of comprehensive ROS otherwise negative. BP 101/81   Pulse 65   Ht 5\' 1"  (1.549 m)   Breastfeeding No   BMI 36.84 kg/m  Taken by patient's at home BP cuff Physical Exam:  Physical exam deferred due to nature of the encounter   Assessment and Plan:   1. Encounter for postpartum visit  - Patient given verbal instructions on how to apply BP cuff to arm and use of the machine, return demonstration and VS taken by patient  2. Encounter for initial prescription of contraceptive pills  - Rx for norethindrone (MICRONOR) 0.35 MG tablet, 6 RF. Can transition to COC's in 6 months.  3. Postpartum depression  - Information provided on postpartum blues  - Offer referral appt to speak Anastasia Pall - Patient declined - Advised that PPD can last up to 1 year or later after delivery and she can call the office at any time to get a referral appt to speak to Roselyn Reef    I discussed the assessment and treatment plan with the patient. The patient was provided an opportunity to ask questions and all were answered. The patient agreed with the plan and demonstrated an understanding of the instructions.   The patient was advised  to call back or seek an in-person evaluation/go to the ED if the symptoms worsen or if the condition fails to improve as anticipated.  I provided 10 minutes of non-face-to-face time during this encounter. There was 5 minutes of chart review time spent prior to this encounter. Total time spent = 15 minutes.    Laury Deep, [place] for [place]

## 2018-08-20 NOTE — Patient Instructions (Signed)

## 2019-02-13 NOTE — L&D Delivery Note (Signed)
OB/GYN Faculty Practice Delivery Note  Annette Glenn is a 24 y.o. G2P1001 s/p vaginal delivery at [redacted]w[redacted]d. She was admitted for elective IOL.   ROM: 3h 5m with clear fluid GBS Status: positive, adequate antibiotics prior to delivery Maximum Maternal Temperature: 98.65F  Labor Progress: Pitocin was started on admission given favorable cervical exam. Pt had AROM for clear fluid at 0215. IUPC and amnioinfusion were started s/p AROM given recurrent variable decelerations. Pt was noted to have complete cervical dilation at 0508. Given terminal bradycardia not improved with discontinuation of pitocin and repositioning to maternal hands and knees, pt was consented to a possible vacuum-assisted delivery. However, given excellent maternal effort with several contractions, pt delivered unassisted without complication.  Delivery Date/Time: [phone] on 01/31/20 Delivery: Called to room and patient was complete and pushing. Head delivered LOA. Tight nuchal cord x2 present; 1 nuchal reduced prior to delivery and second nuchal reduced s/p delivery. Shoulder and body delivered in usual fashion. Infant with spontaneous cry, placed on mother's abdomen, dried and stimulated. Cord clamped x 2 after 1-minute delay, and cut by FOB under my direct supervision. Cord blood drawn. Placenta delivered spontaneously with gentle cord traction. Post-placental IUD was placed as noted below. Fundus firm with massage and Pitocin. Labia, perineum, vagina, and cervix were inspected, notable for left hemostatic periurethral laceration without repair.   Placenta: 3-vessel cord, intact, sent to L&D Complications: none Lacerations: left hemostatic periurethral laceration without repair EBL: 75 ml Analgesia: epidural  Infant: female  APGARs 8 & 9  weight per medical record  Post-Placental IUD Insertion Procedure Note  Patient identified, informed consent signed prior to delivery, signed copy in chart, time out was performed.     Vaginal, labial and perineal areas thoroughly inspected for lacerations. Hemostatic periurethral laceration without need for repair.  [place] IUD grasped between sterile gloved fingers. Sterile lubrication applied to sterile gloved hand for ease of insertion. Fundus identified through abdominal wall using non-insertion hand. IUD inserted to fundus with bimanual technique. IUD carefully released at the fundus and insertion hand gently removed from vagina.    Strings trimmed to the level of the introitus. Patient tolerated procedure well.  Lot # [id number] Expiration Date 03/2023  Patient given post procedure instructions and IUD care card with expiration date.  Patient is asked to keep IUD strings tucked in her vagina until her postpartum follow up visit in 4-6 weeks. Patient advised to abstain from sexual intercourse and pulling on strings before her follow-up visit. Patient verbalized an understanding of the plan of care and agrees.   Guillermina [place], MD OB/GYN Fellow, Faculty Practice

## 2019-06-08 ENCOUNTER — Encounter: Payer: Self-pay | Admitting: General Practice

## 2019-06-08 ENCOUNTER — Ambulatory Visit (INDEPENDENT_AMBULATORY_CARE_PROVIDER_SITE_OTHER): Payer: Medicaid Other | Admitting: *Deleted

## 2019-06-08 ENCOUNTER — Other Ambulatory Visit: Payer: Self-pay

## 2019-06-08 VITALS — BP 126/79 | HR 105 | Temp 97.9°F | Ht 61.0 in | Wt 195.6 lb

## 2019-06-08 DIAGNOSIS — Z3480 Encounter for supervision of other normal pregnancy, unspecified trimester: Secondary | ICD-10-CM | POA: Insufficient documentation

## 2019-06-08 DIAGNOSIS — Z3201 Encounter for pregnancy test, result positive: Secondary | ICD-10-CM | POA: Diagnosis not present

## 2019-06-08 DIAGNOSIS — Z3200 Encounter for pregnancy test, result unknown: Secondary | ICD-10-CM

## 2019-06-08 DIAGNOSIS — Z3400 Encounter for supervision of normal first pregnancy, unspecified trimester: Secondary | ICD-10-CM

## 2019-06-08 LAB — POCT URINE PREGNANCY: Preg Test, Ur: POSITIVE — AB

## 2019-06-08 MED ORDER — PRENATAL GUMMIES/DHA & FA 0.4-32.5 MG PO CHEW: 3.0000 | CHEWABLE_TABLET | Freq: Every day | ORAL | 12 refills | Status: AC

## 2019-06-08 NOTE — Progress Notes (Signed)
   PRENATAL INTAKE SUMMARY  Ms. Patient presents today New OB Nurse Interview.  OB History    Gravida  2   Para  1   Term  1   Preterm      AB      Living  1     SAB      TAB      Ectopic      Multiple  0   Live Births  1          I have reviewed the patient's medical, obstetrical, social, and family histories, medications, and available lab results.  SUBJECTIVE She has no unusual complaints.  OBJECTIVE Initial nurse interview for history (New OB).  EDD: 02/03/20 by LMP GA: [redacted]w[redacted]d G2P1001  GENERAL APPEARANCE: alert, well appearing, in no apparent distress, oriented to person, place and time   ASSESSMENT Positive pregnancy test Normal pregnancy  PLAN Prenatal care-[facility] to completed at next visit with Laury Deep, [place] 07/30/19 Rx for PNV sent to pharmacy Patient has BP cuff and weight scale at home.  Derl Barrow, RN

## 2019-07-30 ENCOUNTER — Encounter: Payer: Self-pay | Admitting: General Practice

## 2019-07-30 ENCOUNTER — Other Ambulatory Visit: Payer: Self-pay

## 2019-07-30 ENCOUNTER — Ambulatory Visit (INDEPENDENT_AMBULATORY_CARE_PROVIDER_SITE_OTHER): Payer: Medicaid Other | Admitting: Obstetrics and Gynecology

## 2019-07-30 ENCOUNTER — Other Ambulatory Visit (HOSPITAL_COMMUNITY)
Admission: RE | Admit: 2019-07-30 | Discharge: 2019-07-30 | Disposition: A | Discharge status: Home / Self Care | Payer: Medicaid Other | Source: Ambulatory Visit | Attending: Obstetrics and Gynecology | Admitting: Obstetrics and Gynecology

## 2019-07-30 ENCOUNTER — Encounter: Payer: Self-pay | Admitting: Obstetrics and Gynecology

## 2019-07-30 VITALS — BP 110/75 | HR 99 | Temp 98.0°F | Wt 198.2 lb

## 2019-07-30 DIAGNOSIS — Z124 Encounter for screening for malignant neoplasm of cervix: Secondary | ICD-10-CM

## 2019-07-30 DIAGNOSIS — E669 Obesity, unspecified: Secondary | ICD-10-CM

## 2019-07-30 DIAGNOSIS — O99210 Obesity complicating pregnancy, unspecified trimester: Secondary | ICD-10-CM | POA: Insufficient documentation

## 2019-07-30 DIAGNOSIS — Z3A13 13 weeks gestation of pregnancy: Secondary | ICD-10-CM

## 2019-07-30 DIAGNOSIS — Z3480 Encounter for supervision of other normal pregnancy, unspecified trimester: Secondary | ICD-10-CM | POA: Diagnosis not present

## 2019-07-30 DIAGNOSIS — O99211 Obesity complicating pregnancy, first trimester: Secondary | ICD-10-CM | POA: Diagnosis not present

## 2019-07-30 DIAGNOSIS — R519 Headache, unspecified: Secondary | ICD-10-CM | POA: Insufficient documentation

## 2019-07-30 DIAGNOSIS — Z3481 Encounter for supervision of other normal pregnancy, first trimester: Secondary | ICD-10-CM | POA: Diagnosis not present

## 2019-07-30 DIAGNOSIS — O26891 Other specified pregnancy related conditions, first trimester: Secondary | ICD-10-CM

## 2019-07-30 DIAGNOSIS — O26892 Other specified pregnancy related conditions, second trimester: Secondary | ICD-10-CM | POA: Insufficient documentation

## 2019-07-30 MED ORDER — CITRANATAL HARMONY 27-1-260 MG PO CAPS: 1.0000 | ORAL_CAPSULE | Freq: Every day | ORAL | 0 refills | Status: DC

## 2019-07-30 MED ORDER — CYCLOBENZAPRINE HCL 10 MG PO TABS: 10.0000 mg | ORAL_TABLET | Freq: Three times a day (TID) | ORAL | 1 refills | Status: DC | PRN

## 2019-07-30 MED ORDER — ASPIRIN 81 MG PO CHEW: 81.0000 mg | CHEWABLE_TABLET | Freq: Every day | ORAL | 6 refills | Status: DC

## 2019-07-30 NOTE — Progress Notes (Signed)
INITIAL OBSTETRICAL VISIT [name] MRN [id number]  Date of birth: [birth year] Chief Complaint:   Initial Prenatal Visit  History of Present Illness:   Annette Glenn is a 24 y.o. G84P1001 Hispanic female at [redacted]w[redacted]d by LMP with an Estimated Date of Delivery: 02/03/20 being seen today for her initial obstetrical visit.  Her obstetrical history is significant for obesity. This is an  unplanned pregnancy. She and the father of the baby (FOB) "Cindee Salt" live together with their 16 month old son. She has a support system that consists of the FOB and her family/friends. Today she reports constant, daily headache.   Patient's last menstrual period was 04/29/2019 (exact date). Last pap n/a due to age Review of Systems:   Pertinent items are noted in HPI Denies cramping/contractions, leakage of fluid, vaginal bleeding, abnormal vaginal discharge w/ itching/odor/irritation, headaches, visual changes, shortness of breath, chest pain, abdominal pain, severe nausea/vomiting, or problems with urination or bowel movements unless otherwise stated above.  Pertinent History Reviewed:  Reviewed past medical,surgical, social, obstetrical and family history.  Reviewed problem list, medications and allergies. OB History  Gravida Para Term Preterm AB Living  2 1 1     1   SAB TAB Ectopic Multiple Live Births        0 1    # Outcome Date GA Lbr Len/2nd Weight Sex Delivery Anes PTL Lv  2 Current           1 Term 06/28/18 [redacted]w[redacted]d 02:32 / 02:51 8 lb 5 oz (3.77 kg) M Vag-Spont EPI  LIV   Physical Assessment:   Vitals:   07/30/19 0952  BP: 110/75  Pulse: 99  Temp: 98 F (36.7 C)  Weight: 198 lb 3.2 oz (89.9 kg)  Body mass index is 37.45 kg/m.       Physical Examination:  General appearance - well appearing, and in no distress  Mental status - alert, oriented to person, place, and time  Psych:  She has a normal mood and affect  Skin - warm and dry, normal color, no suspicious lesions  noted  Chest - effort normal, all lung fields clear to auscultation bilaterally  Heart - normal rate and regular rhythm  Abdomen - soft, nontender  Extremities:  No swelling or varicosities noted  Pelvic - VULVA: normal appearing vulva with no masses, tenderness or lesions  VAGINA: normal appearing vagina with normal color and discharge, no lesions.   CERVIX: normal appearing cervix with clear mucoid discharge, no lesions, no CMT  Thin prep pap is done with reflex HR HPV cotesting  FHTs by doppler: 152 bpm    Nursing Staff Provider  Office Location  [facility] Dating  LMP  Language  English Anatomy US    Flu Vaccine  N/A Genetic Screen  NIPS:   AFP:   First Screen:  Quad:    TDaP vaccine    Hgb A1C or  GTT Early  Third trimester   Rhogam  N/A   LAB RESULTS     Blood Type --/--/O POS, O POS Performed at [facility], [place] [address] [place], [place] [address]  [phone])   Feeding Plan Formula Antibody NEG (05/15 5009)  Contraception IUD Rubella    Circumcision yes RPR Non Reactive (05/15 0746)   Pediatrician  [facility]     Support Person FOB-Erik HCVAb Negative**Positive  Prenatal Classes No HIV       BTL Consent N/A GBS  (For PCN allergy,  check sensitivities)   VBAC Consent N/A Pap     Hgb Electro  Negative  BP Cuff Has at home CF Negative for the 97 mutations   Weight Scale Has at home SMA SMN1 copy number of two    Waterbirth  [ ]  Class [ ]  Consent [ ]  CNM visit    Induction  [ ]  Orders Entered [ ] Foley Y/N   Assessment & Plan:  1) Low-Risk Pregnancy G2P1001 at [redacted]w[redacted]d with an Estimated Date of Delivery: 02/03/20   2) Initial OB visit - Welcomed to practice and introduced self to patient in addition to discussing other advanced practice providers that she may be seeing at this practice - Congratulated patient - Anticipatory guidance on upcoming appointments - Educated on [place] and pregnancy and the integration of virtual appointments  - Educated on  babyscripts app- patient reports she has not received email, encouraged to look in spam folder and to call office if she still has not received email - patient verbalizes understanding    3) Supervision of other normal pregnancy, antepartum - Cytology - PAP( [place]) - Cervicovaginal ancillary only( [place]) - CBC/D/Plt+RPR+Rh+ABO+Rub Ab... - Genetic Screening - Hemoglobin A1c - Glucose, random - Prenat-FeFmCb-DSS-FA-DHA w/o A (CITRANATAL HARMONY) 27-1-260 MG CAPS; Take 1 capsule by mouth daily.  Dispense: 30 capsule; Refill: 0 - [place] MFM OB COMP + 14 WK; Future  4) Pap smear for cervical cancer screening - Cytology - PAP( [place])  5) Headache in Pregnancy - Rx for Flexeril 10 mg every 8 hrs prn headache - Advised to take Tylenol 1000 mg if no relief 1 hour after taking Flexeril - Advised to call, if no relief from recommended meds; consider chiropractor or neurology referral  6) Obesity in Pregnancy - Rx for bASA sent to pharmacy - Ambulatory Referral to Nutrition ordered - HgBA1C pending - Advised of normal weight gain between 11-20 pounds for entire pregnancy  Meds:  Meds ordered this encounter  Medications  . Prenat-FeFmCb-DSS-FA-DHA w/o A (CITRANATAL HARMONY) 27-1-260 MG CAPS    Sig: Take 1 capsule by mouth daily.    Dispense:  30 capsule    Refill:  0    Order Specific Question:   Lot Number?    Answer:   [id number]    Order Specific Question:   Expiration Date?    Answer:   06/11/2020    Order Specific Question:   Quantity    Answer:   30    Comments:   6 boxes/5 caps  . cyclobenzaprine (FLEXERIL) 10 MG tablet    Sig: Take 1 tablet (10 mg total) by mouth every 8 (eight) hours as needed for muscle spasms.    Dispense:  30 tablet    Refill:  1    Order Specific Question:   Supervising Provider    Answer:   Donnamae Jude [[id number]]    Initial labs obtained Continue prenatal vitamins Reviewed n/v relief measures and warning s/s to report Reviewed recommended  weight gain based on pre-gravid BMI Encouraged well-balanced diet Genetic Screening discussed: ordered Cystic fibrosis, SMA, Fragile X screening discussed ordered The nature of [facility] with multiple MDs and other Advanced Practice Providers was explained to patient; also emphasized that residents, students are part of our team.  Discussed optimized OB schedule and video visits. Advised can have an in-office visit whenever she feels she needs to be seen.  Does have own BP cuff. Explained to patient that BP  will be mailed to her house. Check BP weekly, let us know if >140/90. Advised to call during normal business hours and there is an after-hours nurse line available.    Follow-up: Return in about 6 weeks (around 09/10/2019) for Return OB - My Chart video.   Orders Placed This Encounter  Procedures  . Culture, OB Urine  . [place] MFM OB COMP + 14 WK  . CBC/D/Plt+RPR+Rh+ABO+Rub Ab...  . Genetic Screening  . Hemoglobin A1c  . Glucose, random    [employer] MSN, [place] 07/30/2019

## 2019-07-30 NOTE — Patient Instructions (Signed)
Form - Headache Record There are many types and causes of headaches. A headache record can help guide your treatment plan. Use this form to record the details. Bring this form with you to your follow-up visits. Follow your health care provider's instructions on how to describe your headache. You may be asked to:  Use a pain scale. This is a tool to rate the intensity of your headache using words or numbers.  Describe what your headache feels like, such as dull, achy, throbbing, or sharp. Headache record Date: _______________ Time (from start to end): ____________________ Location of the headache: _________________________  Intensity of the headache: ____________________ Description of the headache: ______________________________________________________________  Hours of sleep the night before the headache: __________  Food or drinks before the headache started: ______________________________________________________________________________________  Events before the headache started: _______________________________________________________________________________________________  Symptoms before the headache started: __________________________________________________________________________________________  Symptoms during the headache: __________________________________________________________________________________________________  Treatment: ________________________________________________________________________________________________________________  Effect of treatment: _________________________________________________________________________________________________________  Other comments: ___________________________________________________________________________________________________________ Date: _______________ Time (from start to end): ____________________ Location of the headache: _________________________  Intensity of the headache: ____________________ Description of the  headache: ______________________________________________________________  Hours of sleep the night before the headache: __________  Food or drinks before the headache started: ______________________________________________________________________________________  Events before the headache started: ____________________________________________________________________________________________  Symptoms before the headache started: _________________________________________________________________________________________  Symptoms during the headache: _______________________________________________________________________________________________  Treatment: ________________________________________________________________________________________________________________  Effect of treatment: _________________________________________________________________________________________________________  Other comments: ___________________________________________________________________________________________________________ Date: _______________ Time (from start to end): ____________________ Location of the headache: _________________________  Intensity of the headache: ____________________ Description of the headache: ______________________________________________________________  Hours of sleep the night before the headache: __________  Food or drinks before the headache started: ______________________________________________________________________________________  Events before the headache started: ____________________________________________________________________________________________  Symptoms before the headache started: _________________________________________________________________________________________  Symptoms during the headache: _______________________________________________________________________________________________  Treatment:  ________________________________________________________________________________________________________________  Effect of treatment: _________________________________________________________________________________________________________  Other comments: ___________________________________________________________________________________________________________ Date: _______________ Time (from start to end): ____________________ Location of the headache: _________________________  Intensity of the headache: ____________________ Description of the headache: ______________________________________________________________  Hours of sleep the night before the headache: _________  Food or drinks before the headache started: ______________________________________________________________________________________  Events before the headache started: ____________________________________________________________________________________________  Symptoms before the headache started: _________________________________________________________________________________________  Symptoms during the headache: _______________________________________________________________________________________________  Treatment: ________________________________________________________________________________________________________________  Effect of treatment: _________________________________________________________________________________________________________  Other comments: ___________________________________________________________________________________________________________ Date: _______________ Time (from start to end): ____________________ Location of the headache: _________________________  Intensity of the headache: ____________________ Description of the headache: ______________________________________________________________  Hours of sleep the night before the headache: _________  Food or drinks  before the headache started: ______________________________________________________________________________________  Events before the headache started: ____________________________________________________________________________________________  Symptoms before the headache started: _________________________________________________________________________________________  Symptoms during the headache: _______________________________________________________________________________________________  Treatment: ________________________________________________________________________________________________________________  Effect of treatment: _________________________________________________________________________________________________________  Other comments: ___________________________________________________________________________________________________________ This information is not intended to replace advice given to you by your health care provider. Make sure you discuss any questions you have with your health care provider. Document Revised: 02/17/2018 Document Reviewed: 02/17/2018 Elsevier Patient Education  2020 Elsevier Inc.  

## 2019-07-31 LAB — CBC/D/PLT+RPR+RH+ABO+RUB AB...
Antibody Screen: NEGATIVE
Basophils Absolute: 0 10*3/uL (ref 0.0–0.2)
Basos: 0 %
EOS (ABSOLUTE): 0.1 10*3/uL (ref 0.0–0.4)
Eos: 2 %
HCV Ab: <0.1 s/co ratio (ref 0.0–0.9)
HIV Screen 4th Generation wRfx: NONREACTIVE
Hematocrit: 39.2 % (ref 34.0–46.6)
Hemoglobin: 12.9 g/dL (ref 11.1–15.9)
Hepatitis B Surface Ag: NEGATIVE
Immature Grans (Abs): 0 10*3/uL (ref 0.0–0.1)
Immature Granulocytes: 0 %
Lymphocytes Absolute: 2.2 10*3/uL (ref 0.7–3.1)
Lymphs: 26 %
MCH: 28.3 pg (ref 26.6–33.0)
MCHC: 32.9 g/dL (ref 31.5–35.7)
MCV: 86 fL (ref 79–97)
Monocytes Absolute: 0.4 10*3/uL (ref 0.1–0.9)
Monocytes: 5 %
Neutrophils Absolute: 5.6 10*3/uL (ref 1.4–7.0)
Neutrophils: 67 %
Platelets: 195 10*3/uL (ref 150–450)
RBC: 4.56 x10E6/uL (ref 3.77–5.28)
RDW: 14.3 % (ref 11.7–15.4)
RPR Ser Ql: NONREACTIVE
Rh Factor: POSITIVE
Rubella Antibodies, IGG: 1.99 index (ref >0.99)
WBC: 8.4 10*3/uL (ref 3.4–10.8)

## 2019-07-31 LAB — CERVICOVAGINAL ANCILLARY ONLY
Bacterial Vaginitis (gardnerella): POSITIVE — AB
Candida Glabrata: NEGATIVE
Candida Vaginitis: NEGATIVE
Chlamydia: NEGATIVE
Comment: NEGATIVE
Comment: NEGATIVE
Comment: NEGATIVE
Comment: NEGATIVE
Comment: NEGATIVE
Comment: NORMAL
Neisseria Gonorrhea: NEGATIVE
Trichomonas: NEGATIVE

## 2019-07-31 LAB — HCV INTERPRETATION

## 2019-07-31 LAB — HEMOGLOBIN A1C
Est. average glucose Bld gHb Est-mCnc: 94 mg/dL
Hgb A1c MFr Bld: 4.9 % (ref 4.8–5.6)

## 2019-07-31 LAB — GLUCOSE, RANDOM: Glucose: 80 mg/dL (ref 65–99)

## 2019-08-01 LAB — URINE CULTURE, OB REFLEX

## 2019-08-01 LAB — CULTURE, OB URINE

## 2019-08-02 NOTE — Progress Notes (Signed)
Please treat for BV

## 2019-08-03 ENCOUNTER — Telehealth: Payer: Self-pay | Admitting: *Deleted

## 2019-08-03 DIAGNOSIS — N760 Acute vaginitis: Secondary | ICD-10-CM

## 2019-08-03 LAB — CYTOLOGY - PAP: Diagnosis: NEGATIVE

## 2019-08-03 MED ORDER — METRONIDAZOLE 500 MG PO TABS: 500.0000 mg | ORAL_TABLET | Freq: Two times a day (BID) | ORAL | 0 refills | Status: DC

## 2019-08-03 NOTE — Telephone Encounter (Signed)
-----   Message from Laury Deep, [place] sent at 08/02/2019 11:22 AM EDT ----- Please treat for BV

## 2019-08-05 ENCOUNTER — Encounter: Payer: Self-pay | Admitting: General Practice

## 2019-08-18 ENCOUNTER — Other Ambulatory Visit: Payer: Medicaid Other

## 2019-09-10 ENCOUNTER — Other Ambulatory Visit: Payer: Self-pay | Admitting: Obstetrics and Gynecology

## 2019-09-10 ENCOUNTER — Telehealth: Payer: Self-pay | Admitting: General Practice

## 2019-09-10 ENCOUNTER — Telehealth: Payer: Medicaid Other | Admitting: Obstetrics and Gynecology

## 2019-09-10 ENCOUNTER — Other Ambulatory Visit: Payer: Self-pay | Admitting: *Deleted

## 2019-09-10 ENCOUNTER — Encounter: Payer: Medicaid Other | Attending: Obstetrics and Gynecology | Admitting: Registered"

## 2019-09-10 ENCOUNTER — Ambulatory Visit: Payer: Medicaid Other | Admitting: Registered"

## 2019-09-10 ENCOUNTER — Other Ambulatory Visit: Payer: Self-pay

## 2019-09-10 ENCOUNTER — Ambulatory Visit: Payer: Medicaid Other | Attending: Obstetrics and Gynecology

## 2019-09-10 DIAGNOSIS — Z362 Encounter for other antenatal screening follow-up: Secondary | ICD-10-CM

## 2019-09-10 DIAGNOSIS — Z363 Encounter for antenatal screening for malformations: Secondary | ICD-10-CM

## 2019-09-10 DIAGNOSIS — E669 Obesity, unspecified: Secondary | ICD-10-CM | POA: Diagnosis not present

## 2019-09-10 DIAGNOSIS — Z3480 Encounter for supervision of other normal pregnancy, unspecified trimester: Secondary | ICD-10-CM | POA: Insufficient documentation

## 2019-09-10 DIAGNOSIS — Z713 Dietary counseling and surveillance: Secondary | ICD-10-CM | POA: Diagnosis not present

## 2019-09-10 DIAGNOSIS — O99212 Obesity complicating pregnancy, second trimester: Secondary | ICD-10-CM

## 2019-09-10 DIAGNOSIS — Z3A19 19 weeks gestation of pregnancy: Secondary | ICD-10-CM | POA: Diagnosis not present

## 2019-09-10 DIAGNOSIS — O99210 Obesity complicating pregnancy, unspecified trimester: Secondary | ICD-10-CM | POA: Diagnosis not present

## 2019-09-10 NOTE — Telephone Encounter (Signed)
Called patient to check in for virtual visit, but no answer.  Left message on VM for patient to give our office a call.

## 2019-09-10 NOTE — Progress Notes (Signed)
Medical Nutrition Therapy:  Appt start time: 0925 end time:  1010.   Referral: Obesity affecting pregnancy EDD: 02/03/20 Pt reports her pre-pregnancy weight was ~190 lbs.   Assessment:  Primary concerns today: Pt states she is here because MD asked her to come.   Pt states she has not been very active in this or her prior pregnancy but had heard it can help make delivery easier and states she is motivated to start walking more.  Pt states she likes a variety of vegetables and usually includes with dinner. Pt states where she can make a change is having smaller portions at dinner. Pt states she cooks most meals at home and enjoys her cooking more than eating out, sometimes goes back for seconds even when not hungry because it tastes good. Pt states she often does not eat much during the day and is very hungry going into dinner.  Pt states usually has dinner with family. When not eating with family patient states she will watch something on her phone or some other distractions.  Preferred Learning Style:    No preference indicated   Learning Readiness:   Ready  MEDICATIONS: reviewed   DIETARY INTAKE:  Usual eating pattern includes 2 meals and 2-3 snacks per day.  24-hr recall:  B ( AM): Juice (wakes up around 11:30 am)  Snk ( AM): chips  L ( PM): eggs, weenies Snk ( PM): chips, sometimes cookies D ( PM): meat and 2 sides Snk ( PM): none Beverages: water, juice  Meat intake includes fish/seafood 1-2x week, chicken, beef 3x/week; no pork. (Folate & Iron) pinto beans 2-3x/week; collards occasionally, does not like spinach.  Usual physical activity: ADLs  Estimated energy needs: not assessed  Progress Towards Goal(s):  New goals.   Nutritional Diagnosis:  NB-1.1 Food and nutrition-related knowledge deficit As related to importance of mindful eating.  As evidenced by states overeating at dinner, eating fast, getting seconds when not hungry.    Intervention:  Nutrition  Education topics:  Balanced eating and healthier snacks  Mindful eating to help eat appropriate portions  Juice vs whole fruit  Importance of activity for health and improve delivery experience  Teaching Method Utilized:  Visual Auditory Hands on  Plan:  Increase activity by walking 3-4 times/week Eat more fruit and drink less juice Eat healthier snacks Eating dinner more mindfully  Handouts given during visit include:  MyPlate for [employer] Eating Plate  Barriers to learning/adherence to lifestyle change: none  Demonstrated degree of understanding via:  Teach Back   Monitoring/Evaluation:  Dietary intake, exercise, and body weight prn.

## 2019-09-11 NOTE — Patient Instructions (Signed)
Plan:  Increase activity by walking 3-4 times/week Eat more fruit and drink less juice Eat healthier snacks Eating dinner more mindfully

## 2019-09-15 ENCOUNTER — Other Ambulatory Visit: Payer: Self-pay

## 2019-09-15 ENCOUNTER — Other Ambulatory Visit: Payer: Self-pay | Admitting: *Deleted

## 2019-09-15 DIAGNOSIS — O99210 Obesity complicating pregnancy, unspecified trimester: Secondary | ICD-10-CM

## 2019-11-05 ENCOUNTER — Other Ambulatory Visit: Payer: Self-pay

## 2019-11-05 ENCOUNTER — Other Ambulatory Visit: Payer: Self-pay | Admitting: *Deleted

## 2019-11-05 ENCOUNTER — Ambulatory Visit: Payer: Medicaid Other | Attending: Obstetrics and Gynecology

## 2019-11-05 DIAGNOSIS — Z3A27 27 weeks gestation of pregnancy: Secondary | ICD-10-CM | POA: Diagnosis not present

## 2019-11-05 DIAGNOSIS — Z362 Encounter for other antenatal screening follow-up: Secondary | ICD-10-CM | POA: Insufficient documentation

## 2019-11-05 DIAGNOSIS — O99210 Obesity complicating pregnancy, unspecified trimester: Secondary | ICD-10-CM

## 2019-11-05 DIAGNOSIS — O99212 Obesity complicating pregnancy, second trimester: Secondary | ICD-10-CM | POA: Diagnosis not present

## 2019-11-17 ENCOUNTER — Other Ambulatory Visit (INDEPENDENT_AMBULATORY_CARE_PROVIDER_SITE_OTHER): Payer: Medicaid Other

## 2019-11-17 ENCOUNTER — Other Ambulatory Visit: Payer: Self-pay

## 2019-11-17 DIAGNOSIS — Z3480 Encounter for supervision of other normal pregnancy, unspecified trimester: Secondary | ICD-10-CM | POA: Diagnosis not present

## 2019-11-17 NOTE — Progress Notes (Signed)
   Patient in clinic for 28 week labs. Patient declined Flu/Tdap today.  Derl Barrow, RN

## 2019-11-18 LAB — GLUCOSE TOLERANCE, 2 HOURS W/ 1HR
Glucose, 1 hour: 157 mg/dL (ref 65–179)
Glucose, 2 hour: 97 mg/dL (ref 65–152)
Glucose, Fasting: 78 mg/dL (ref 65–91)

## 2019-11-18 LAB — CBC
Hematocrit: 35.6 % (ref 34.0–46.6)
Hemoglobin: 11.8 g/dL (ref 11.1–15.9)
MCH: 28.6 pg (ref 26.6–33.0)
MCHC: 33.1 g/dL (ref 31.5–35.7)
MCV: 86 fL (ref 79–97)
Platelets: 201 10*3/uL (ref 150–450)
RBC: 4.13 x10E6/uL (ref 3.77–5.28)
RDW: 13.3 % (ref 11.7–15.4)
WBC: 8.1 10*3/uL (ref 3.4–10.8)

## 2019-11-18 LAB — HIV ANTIBODY (ROUTINE TESTING W REFLEX): HIV Screen 4th Generation wRfx: NONREACTIVE

## 2019-11-18 LAB — RPR: RPR Ser Ql: NONREACTIVE

## 2019-12-02 ENCOUNTER — Other Ambulatory Visit: Payer: Self-pay

## 2019-12-02 ENCOUNTER — Encounter: Payer: Self-pay | Admitting: Certified Nurse Midwife

## 2019-12-02 ENCOUNTER — Ambulatory Visit (INDEPENDENT_AMBULATORY_CARE_PROVIDER_SITE_OTHER): Payer: Medicaid Other | Admitting: Certified Nurse Midwife

## 2019-12-02 VITALS — BP 119/81 | HR 101 | Temp 98.3°F | Wt 202.4 lb

## 2019-12-02 DIAGNOSIS — O99210 Obesity complicating pregnancy, unspecified trimester: Secondary | ICD-10-CM

## 2019-12-02 DIAGNOSIS — Z3480 Encounter for supervision of other normal pregnancy, unspecified trimester: Secondary | ICD-10-CM

## 2019-12-02 DIAGNOSIS — O0930 Supervision of pregnancy with insufficient antenatal care, unspecified trimester: Secondary | ICD-10-CM

## 2019-12-02 DIAGNOSIS — Z3A31 31 weeks gestation of pregnancy: Secondary | ICD-10-CM

## 2019-12-02 NOTE — Patient Instructions (Signed)

## 2019-12-02 NOTE — Progress Notes (Addendum)
   PRENATAL VISIT NOTE  Subjective:  Annette Glenn is a 24 y.o. G2P1001 at [redacted]w[redacted]d being seen today for ongoing prenatal care.  She is currently monitored for the following issues for this low-risk pregnancy and has Supervision of other normal pregnancy, antepartum; Obesity affecting pregnancy, antepartum; Pregnancy headache in second trimester; and Limited prenatal care, antepartum on their problem list.  Patient reports no complaints.  Contractions: Not present. Vag. Bleeding: None.  Movement: Present. Denies leaking of fluid.   The following portions of the patient's history were reviewed and updated as appropriate: allergies, current medications, past family history, past medical history, past social history, past surgical history and problem list.   Objective:   Vitals:   12/02/19 1024  BP: 119/81  Pulse: (!) 101  Temp: 98.3 F (36.8 C)  Weight: 202 lb 6.4 oz (91.8 kg)    Fetal Status: Fetal Heart Rate (bpm): 145 Fundal Height: 35 cm Movement: Present     General:  Alert, oriented and cooperative. Patient is in no acute distress.  Skin: Skin is warm and dry. No rash noted.   Cardiovascular: Normal heart rate noted  Respiratory: Normal respiratory effort, no problems with respiration noted  Abdomen: Soft, gravid, appropriate for gestational age.  Pain/Pressure: Absent     Pelvic: Cervical exam deferred        Extremities: Normal range of motion.  Edema: None  Mental Status: Normal mood and affect. Normal behavior. Normal judgment and thought content.   Assessment and Plan:  Pregnancy: G2P1001 at [redacted]w[redacted]d 1. Supervision of other normal pregnancy, antepartum - Patient doing well, no complaints - Routine prenatal care  - Anticipatory guidance on upcoming appointments with next appointment in person due to lapse of care   2. Obesity affecting pregnancy, antepartum - serial fetal growth ultrasounds scheduled by MFM  3. Limited prenatal care, antepartum - Patient has not been  seen for OB appointments since NOB in June- she has gone to 2 ultrasound appointments doing that time but has lapse in care - Educated and discussed importance on attending all appointments and receiving prenatal care, patient verbalizes understanding.   4. [redacted] weeks gestation of pregnancy   Preterm labor symptoms and general obstetric precautions including but not limited to vaginal bleeding, contractions, leaking of fluid and fetal movement were reviewed in detail with the patient. Please refer to After Visit Summary for other counseling recommendations.   Return in about 2 weeks (around 12/16/2019) for [place]- in person .  Future Appointments  Date Time Provider [place]  12/16/2019 10:50 AM Laury Deep, CNM [facility] None  12/22/2019  1:45 PM [facility] NURSE [facility]  12/22/2019  2:00 PM [facility] US1 [facility] [place]    Lajean Manes, CNM

## 2019-12-16 ENCOUNTER — Encounter: Payer: Medicaid Other | Admitting: Obstetrics and Gynecology

## 2019-12-22 ENCOUNTER — Ambulatory Visit: Payer: Medicaid Other | Attending: Obstetrics and Gynecology | Admitting: *Deleted

## 2019-12-22 ENCOUNTER — Encounter: Payer: Self-pay | Admitting: *Deleted

## 2019-12-22 ENCOUNTER — Other Ambulatory Visit: Payer: Self-pay

## 2019-12-22 ENCOUNTER — Ambulatory Visit (HOSPITAL_BASED_OUTPATIENT_CLINIC_OR_DEPARTMENT_OTHER): Payer: Medicaid Other

## 2019-12-22 DIAGNOSIS — Z3480 Encounter for supervision of other normal pregnancy, unspecified trimester: Secondary | ICD-10-CM

## 2019-12-22 DIAGNOSIS — O99213 Obesity complicating pregnancy, third trimester: Secondary | ICD-10-CM | POA: Insufficient documentation

## 2019-12-22 DIAGNOSIS — Z362 Encounter for other antenatal screening follow-up: Secondary | ICD-10-CM

## 2019-12-22 DIAGNOSIS — E669 Obesity, unspecified: Secondary | ICD-10-CM | POA: Diagnosis not present

## 2019-12-22 DIAGNOSIS — Z3A33 33 weeks gestation of pregnancy: Secondary | ICD-10-CM

## 2019-12-22 DIAGNOSIS — O99210 Obesity complicating pregnancy, unspecified trimester: Secondary | ICD-10-CM

## 2019-12-22 NOTE — Progress Notes (Signed)
Pulse palpated-116

## 2019-12-24 ENCOUNTER — Ambulatory Visit: Payer: Medicaid Other

## 2019-12-30 ENCOUNTER — Encounter: Payer: Medicaid Other | Admitting: Certified Nurse Midwife

## 2020-01-06 ENCOUNTER — Other Ambulatory Visit (HOSPITAL_COMMUNITY)
Admission: RE | Admit: 2020-01-06 | Discharge: 2020-01-06 | Disposition: A | Discharge status: Home / Self Care | Payer: Medicaid Other | Source: Ambulatory Visit | Attending: Obstetrics and Gynecology | Admitting: Obstetrics and Gynecology

## 2020-01-06 ENCOUNTER — Ambulatory Visit (INDEPENDENT_AMBULATORY_CARE_PROVIDER_SITE_OTHER): Payer: Medicaid Other | Admitting: Obstetrics and Gynecology

## 2020-01-06 ENCOUNTER — Other Ambulatory Visit: Payer: Self-pay

## 2020-01-06 VITALS — BP 126/85 | HR 111 | Temp 98.1°F | Wt 199.8 lb

## 2020-01-06 DIAGNOSIS — Z3480 Encounter for supervision of other normal pregnancy, unspecified trimester: Secondary | ICD-10-CM | POA: Diagnosis not present

## 2020-01-06 DIAGNOSIS — O0930 Supervision of pregnancy with insufficient antenatal care, unspecified trimester: Secondary | ICD-10-CM

## 2020-01-06 DIAGNOSIS — Z3A36 36 weeks gestation of pregnancy: Secondary | ICD-10-CM

## 2020-01-06 DIAGNOSIS — O26843 Uterine size-date discrepancy, third trimester: Secondary | ICD-10-CM

## 2020-01-06 DIAGNOSIS — O99210 Obesity complicating pregnancy, unspecified trimester: Secondary | ICD-10-CM

## 2020-01-06 NOTE — Progress Notes (Signed)
   LOW-RISK PREGNANCY OFFICE VISIT [name] MRN [id number]  Date of birth: 09/13/1995 Chief Complaint:   Routine Prenatal Visit  History of Present Illness:   Annette Glenn is a 24 y.o. G13P1001 female at [redacted]w[redacted]d with an Estimated Date of Delivery: 02/03/20 being seen today for ongoing management of a low-risk pregnancy.  Today she reports no complaints. She reports she has missed appointments due to work schedule; she has quit her job now. Contractions: Not present. Vag. Bleeding: None.  Movement: Present. denies leaking of fluid. Review of Systems:   Pertinent items are noted in HPI Denies abnormal vaginal discharge w/ itching/odor/irritation, headaches, visual changes, shortness of breath, chest pain, abdominal pain, severe nausea/vomiting, or problems with urination or bowel movements unless otherwise stated above. Pertinent History Reviewed:  Reviewed past medical,surgical, social, obstetrical and family history.  Reviewed problem list, medications and allergies. Physical Assessment:   Vitals:   01/06/20 1549  BP: 126/85  Pulse: (!) 111  Temp: 98.1 F (36.7 C)  Weight: 199 lb 12.8 oz (90.6 kg)  Body mass index is 37.75 kg/m.        Physical Examination:   General appearance: Well appearing, and in no distress  Mental status: Alert, oriented to person, place, and time  Skin: Warm & dry  Cardiovascular: Normal heart rate noted  Respiratory: Normal respiratory effort, no distress  Abdomen: Soft, gravid, nontender  Pelvic: Cervical exam performed         Extremities: Edema: None  Fetal Status: Fetal Heart Rate (bpm): 150   Movement: Present    No results found for this or any previous visit (from the past 24 hour(s)).  Assessment & Plan:  1) Low-risk pregnancy G2P1001 at [redacted]w[redacted]d with an Estimated Date of Delivery: 02/03/20   2) Supervision of other normal pregnancy, antepartum  - Cervicovaginal ancillary only( [place]),  - Culture, beta strep  (group b only)  3) Obesity affecting pregnancy, antepartum  4) Limited prenatal care, antepartum - Expressed the importance of keeping OB appts to ensure healthy pregnancy, mom and baby - Will have in-person visits through end of pregnancy  5) [redacted] weeks gestation of pregnancy  6) Uterine size date discrepancy pregnancy, third trimester - Needs growth U/S   Meds: No orders of the defined types were placed in this encounter.  Labs/procedures today: GC/CT and GBS  Plan:  Continue routine obstetrical care   Reviewed: Preterm labor symptoms and general obstetric precautions including but not limited to vaginal bleeding, contractions, leaking of fluid and fetal movement were reviewed in detail with the patient.  All questions were answered.    Follow-up: No follow-ups on file.  Orders Placed This Encounter  Procedures  . Culture, beta strep (group b only)   Laury Deep MSN, CNM 01/06/2020

## 2020-01-08 LAB — CERVICOVAGINAL ANCILLARY ONLY
Bacterial Vaginitis (gardnerella): NEGATIVE
Candida Glabrata: NEGATIVE
Candida Vaginitis: POSITIVE — AB
Chlamydia: NEGATIVE
Comment: NEGATIVE
Comment: NEGATIVE
Comment: NEGATIVE
Comment: NEGATIVE
Comment: NEGATIVE
Comment: NORMAL
Neisseria Gonorrhea: NEGATIVE
Trichomonas: NEGATIVE

## 2020-01-09 LAB — CULTURE, BETA STREP (GROUP B ONLY): Strep Gp B Culture: POSITIVE — AB

## 2020-01-11 ENCOUNTER — Telehealth: Payer: Self-pay | Admitting: *Deleted

## 2020-01-11 DIAGNOSIS — B3731 Acute candidiasis of vulva and vagina: Secondary | ICD-10-CM

## 2020-01-11 MED ORDER — TERCONAZOLE 0.4 % VA CREA: 1.0000 | TOPICAL_CREAM | Freq: Every day | VAGINAL | 0 refills | Status: DC

## 2020-01-11 NOTE — Telephone Encounter (Signed)
-----   Message from Laury Deep, [place] sent at 01/11/2020 10:42 AM EST ----- Please treat for yeast

## 2020-01-22 ENCOUNTER — Ambulatory Visit (INDEPENDENT_AMBULATORY_CARE_PROVIDER_SITE_OTHER): Payer: Medicaid Other | Admitting: Obstetrics & Gynecology

## 2020-01-22 ENCOUNTER — Encounter: Payer: Self-pay | Admitting: Obstetrics & Gynecology

## 2020-01-22 ENCOUNTER — Other Ambulatory Visit: Payer: Self-pay

## 2020-01-22 VITALS — BP 113/79 | HR 97 | Temp 98.2°F | Wt 202.4 lb

## 2020-01-22 DIAGNOSIS — O26843 Uterine size-date discrepancy, third trimester: Secondary | ICD-10-CM

## 2020-01-22 DIAGNOSIS — Z3A38 38 weeks gestation of pregnancy: Secondary | ICD-10-CM

## 2020-01-22 DIAGNOSIS — Z3480 Encounter for supervision of other normal pregnancy, unspecified trimester: Secondary | ICD-10-CM

## 2020-01-22 NOTE — Progress Notes (Signed)
PRENATAL VISIT NOTE  Subjective:  Annette Glenn is a 24 y.o. G2P1001 at [redacted]w[redacted]d being seen today for ongoing prenatal care.  She is currently monitored for the following issues for this low-risk pregnancy and has Supervision of other normal pregnancy, antepartum; Obesity affecting pregnancy, antepartum; Pregnancy headache in second trimester; and Limited prenatal care, antepartum on their problem list.  Patient reports no complaints.  Contractions: Not present. Vag. Bleeding: None.  Movement: Present. Denies leaking of fluid.   The following portions of the patient's history were reviewed and updated as appropriate: allergies, current medications, past family history, past medical history, past social history, past surgical history and problem list.   Objective:   Vitals:   01/22/20 1017  BP: 113/79  Pulse: 97  Temp: 98.2 F (36.8 C)  Weight: 202 lb 6.4 oz (91.8 kg)    Fetal Status: Fetal Heart Rate (bpm): 140 Fundal Height: 41 cm Movement: Present     General:  Alert, oriented and cooperative. Patient is in no acute distress.  Skin: Skin is warm and dry. No rash noted.   Cardiovascular: Normal heart rate noted  Respiratory: Normal respiratory effort, no problems with respiration noted  Abdomen: Soft, gravid, appropriate for gestational age.  Pain/Pressure: Present     Pelvic: Cervical exam deferred        Extremities: Normal range of motion.  Edema: None  Mental Status: Normal mood and affect. Normal behavior. Normal judgment and thought content.   Imaging: [place] MFM OB FOLLOW UP  Result Date: 12/22/2019 ----------------------------------------------------------------------  OBSTETRICS REPORT                       (Signed Final 12/22/2019 03:05 pm) ---------------------------------------------------------------------- Patient Info  ID #:       [id number]                          D.O.B.:  Feb 05, 1996 (24 yrs)  Name:       Annette Glenn                Visit Date: 12/22/2019 02:25 pm  ---------------------------------------------------------------------- Performed By  Attending:        Sander Nephew      Referred By:      [facility]                    MD  Performed By:     Jeanene Erb BS,      Location[facility]         [facility]                    RD[facility]                                     [facility]                                                             [facility] ---------------------------------------------------------------------- Orders  #  Description                           Code        Ordered By  1  [place] MFM OB FOLLOW UP                   [phone]    [facility] ----------------------------------------------------------------------  #  Order #                     Accession #                Episode #  1  [id number]                   [id number]                 [id number] ---------------------------------------------------------------------- Indications  Obesity complicating pregnancy, third          O99.213  trimester (Pregravid BMI 36.9)  Low Risk NIPS (Negative Horizon)  Encounter for other antenatal screening        Z36.2  follow-up  [redacted] weeks gestation of pregnancy                Z3A.33 ---------------------------------------------------------------------- Fetal Evaluation  Num Of Fetuses:         1  Fetal Heart Rate(bpm):  166  Cardiac Activity:       Observed  Presentation:           Cephalic  Placenta:               Posterior  P. Cord Insertion:      Previously Visualized  Amniotic Fluid  AFI FV:      Within normal limits  AFI Sum(cm)     %Tile       Largest Pocket(cm)  19.15           71          6.18  RUQ(cm)       RLQ(cm)       LUQ(cm)        LLQ(cm)  4.77          2.62          6.18           5.58 ---------------------------------------------------------------------- Biometry  BPD:      87.6  mm     G. Age:  35w 3d         86  %    CI:        74.48   %    70 - 86                                                           FL/HC:      20.0   %    19.4 - 21.8  HC:      322.2  mm     G. Age:  36w 3d         78  %    HC/AC:      0.97        0.96 - 1.11  AC:      332.5  mm  G. Age:  37w 1d       > 99  %    FL/BPD:     73.4   %    71 - 87  FL:       64.3  mm     G. Age:  33w 1d         23  %    FL/AC:      19.3   %    20 - 24  Est. FW:    2798  gm      6 lb 3 oz     94  % ---------------------------------------------------------------------- OB History  Gravidity:    2         Term:   1        Prem:   0        SAB:   0  TOP:          0       Ectopic:  0        Living: 1 ---------------------------------------------------------------------- Gestational Age  LMP:           33w 6d        Date:  04/29/19                 EDD:   02/03/20  U/S Today:     35w 4d                                        EDD:   01/22/20  Best:          33w 6d     Det. By:  LMP  (04/29/19)          EDD:   02/03/20 ---------------------------------------------------------------------- Anatomy  Cranium:               Appears normal         LVOT:                   Previously seen  Cavum:                 Previously seen        Aortic Arch:            Previously seen  Ventricles:            Appears normal         Ductal Arch:            Previously seen  Choroid Plexus:        Previously seen        Diaphragm:              Appears normal  Cerebellum:            Previously seen        Stomach:                Appears normal, left                                                                        sided  Posterior Fossa:  Previously seen        Abdomen:                Appears normal  Nuchal Fold:           Not applicable (>06    Abdominal Wall:         Previously seen                         wks GA)  Face:                  Orbits and profile     Cord Vessels:           Previously seen                         previously seen  Lips:                  Previously seen        Kidneys:                Appear normal  Palate:                Previously  seen        Bladder:                Appears normal  Thoracic:              Appears normal         Spine:                  Previously seen  Heart:                 Previously seen        Upper Extremities:      Previously seen  RVOT:                  Appears normal         Lower Extremities:      Previously seen  Other:  Heels/feet, open hands/5th digits, and Nasal bone prev visualized. ---------------------------------------------------------------------- Cervix Uterus Adnexa  Cervix  Not visualized (advanced GA >24wks) ---------------------------------------------------------------------- Impression  Follow up growth due to elevated BMI and to complete fetal  anatomy  Normal interval growth with measurements greater than  dates.  Good fetal movement and amniotic fluid volume  She reports having an normal 1hr GTT. ---------------------------------------------------------------------- Recommendations  Follow up growth was not scheduled today. However, it may  be reasonable for a follow up growth at 36-37 weeks given  AC of 99th%. ----------------------------------------------------------------------               Sander Nephew, MD Electronically Signed Final Report   12/22/2019 03:05 pm ----------------------------------------------------------------------  [place] MFM OB FOLLOW UP  Result Date: 11/05/2019 ----------------------------------------------------------------------  OBSTETRICS REPORT                       (Signed Final 11/05/2019 04:13 pm) ---------------------------------------------------------------------- Patient Info  ID #:       [id number]                          D.O.B.:  [birth year] (23 yrs)  Name:       Annette Glenn                Visit Date: 11/05/2019 10:47 am ---------------------------------------------------------------------- Performed  By  Attending:        Sander Nephew      Referred By:       Nolon Lennert                    MD  Performed By:     Georgie Chard        Location:           [facility]                    RD[facility]                                      [facility] at                                                              [place] [facility]                                                              [facility] ---------------------------------------------------------------------- Orders  #  Description                           Code        Ordered By  1  [place] MFM OB FOLLOW UP                   76816.01    [facility] ----------------------------------------------------------------------  #  Order #                     Accession #                Episode #  1  [id number]                   [id number]                 [id number] ---------------------------------------------------------------------- Indications  Obesity complicating pregnancy, second          O99.212  trimester (pregravid BMI 36.9)  Encounter for other antenatal screening         Z36.2  follow-up (low risk NIPS, Neg Horizon 14)  [redacted] weeks gestation of pregnancy                 Z3A.27 ---------------------------------------------------------------------- Fetal Evaluation  Num Of Fetuses:          1  Fetal Heart              125  Rate(bpm):  Cardiac Activity:        Observed  Presentation:            Cephalic  Placenta:                Posterior  P. Cord Insertion:       Visualized  Amniotic Fluid  AFI FV:      Within normal limits  Largest Pocket(cm)                              7.54 ---------------------------------------------------------------------- Biometry  BPD:      73.6  mm     G. Age:  29w 4d         96  %    CI:         74.77  %    70 - 86                                                          FL/HC:       18.4  %    18.6 - 20.4  HC:      270.1  mm     G. Age:  29w 3d         20  %    HC/AC:       1.08       1.05 - 1.21  AC:      250.2  mm     G. Age:  29w 2d         93  %    FL/BPD:      67.7  %    71 - 87  FL:       49.8  mm     G. Age:  26w 6d         26  %    FL/AC:       19.9  %    20  - 24  HUM:      46.2  mm     G. Age:  27w 2d         49  %  Est. FW:    1234   g    2 lb 12 oz      88  %                     m ---------------------------------------------------------------------- OB History  Gravidity:    2         Term:   1        Prem:   0         SAB:   0  TOP:          0       Ectopic:  0        Living: 1 ---------------------------------------------------------------------- Gestational Age  LMP:           27w 1d        Date:  04/29/19                 EDD:    02/03/20  U/S Today:     28w 6d                                        EDD:    01/22/20  Best:          27w 1d     Det. By:  LMP  (04/29/19)          EDD:    02/03/20 ---------------------------------------------------------------------- Anatomy  Cranium:  Appears normal         LVOT:                   Appears normal  Cavum:                 Appears normal         Aortic Arch:            Appears normal  Ventricles:            Appears normal         Ductal Arch:            Appears normal  Choroid Plexus:        Previously seen        Diaphragm:              Appears normal  Cerebellum:            Previously seen        Stomach:                Appears normal,                                                                        left sided  Posterior Fossa:       Previously seen        Abdomen:                Appears normal  Nuchal Fold:           Not applicable (>92    Abdominal Wall:         Previously seen                         wks GA)  Face:                  Orbits and profile     Cord Vessels:           Previously seen                         previously seen  Lips:                  Previously seen        Kidneys:                Appear normal  Palate:                Previously seen        Bladder:                Appears normal  Thoracic:              Appears normal         Spine:                  Previously seen  Heart:                 Appears normal         Upper Extremities:      Previously seen                          (  4CH, axis, and                         situs)  RVOT:                  Appears normal         Lower Extremities:      Previously seen  Other:  Heels/feet and open hands/5th digits prev visualized. Nasal bone          prev visualized. ---------------------------------------------------------------------- Impression  Follow up growth due to anatomy and obesity.  Normal interval growth with measurements consistent with  dates  Good fetal movement and amniotic fluid volume ---------------------------------------------------------------------- Recommendations  Follow up growth in 7 weeks. ----------------------------------------------------------------------               Sander Nephew, MD Electronically Signed Final Report   11/05/2019 04:13 pm ----------------------------------------------------------------------   Assessment and Plan:  Pregnancy: G2P1001 at [redacted]w[redacted]d 1. Uterine size date discrepancy pregnancy, third trimester Last EFW 94% with AC 99%. Discussed concern about macrosomia, risk of shoulder dystocia, possible need for cesarean delivery etc. She is also favorable (cervix was 3/50/-2 last visit) and we can consider IOL at 39 weeks. Will follow up results and manage accordingly. - [place] MFM OB FOLLOW UP; Future  2. [redacted] weeks gestation of pregnancy 3. Supervision of other normal pregnancy, antepartum Term labor symptoms and general obstetric precautions including but not limited to vaginal bleeding, contractions, leaking of fluid and fetal movement were reviewed in detail with the patient. Please refer to After Visit Summary for other counseling recommendations.   Return in about 1 week (around 01/29/2020) for OFFICE OB VISIT (MD or APP).  No future appointments.  Verita Schneiders, MD

## 2020-01-22 NOTE — Patient Instructions (Signed)
Return to office for any scheduled appointments. Call the office or go to the MAU at [facility] [place] if:  You begin to have strong, frequent contractions  Your water breaks.  Sometimes it is a big gush of fluid, sometimes it is just a trickle that keeps getting your panties wet or running down your legs  You have vaginal bleeding.  It is normal to have a small amount of spotting if your cervix was checked.   You do not feel your baby moving like normal.  If you do not, get something to eat and drink and lay down and focus on feeling your baby move.   If your baby is still not moving like normal, you should call the office or go to MAU.  Any other obstetric concerns.   

## 2020-01-27 ENCOUNTER — Other Ambulatory Visit: Payer: Self-pay

## 2020-01-27 ENCOUNTER — Ambulatory Visit: Payer: Medicaid Other | Admitting: *Deleted

## 2020-01-27 ENCOUNTER — Encounter: Payer: Self-pay | Admitting: *Deleted

## 2020-01-27 ENCOUNTER — Ambulatory Visit: Payer: Medicaid Other | Attending: Obstetrics and Gynecology

## 2020-01-27 DIAGNOSIS — O99213 Obesity complicating pregnancy, third trimester: Secondary | ICD-10-CM

## 2020-01-27 DIAGNOSIS — Z3A39 39 weeks gestation of pregnancy: Secondary | ICD-10-CM | POA: Diagnosis not present

## 2020-01-27 DIAGNOSIS — Z3480 Encounter for supervision of other normal pregnancy, unspecified trimester: Secondary | ICD-10-CM

## 2020-01-27 DIAGNOSIS — E669 Obesity, unspecified: Secondary | ICD-10-CM | POA: Diagnosis not present

## 2020-01-27 DIAGNOSIS — O26843 Uterine size-date discrepancy, third trimester: Secondary | ICD-10-CM | POA: Insufficient documentation

## 2020-01-27 DIAGNOSIS — Z362 Encounter for other antenatal screening follow-up: Secondary | ICD-10-CM

## 2020-01-29 ENCOUNTER — Ambulatory Visit (INDEPENDENT_AMBULATORY_CARE_PROVIDER_SITE_OTHER): Payer: Medicaid Other

## 2020-01-29 ENCOUNTER — Other Ambulatory Visit: Payer: Self-pay

## 2020-01-29 VITALS — BP 108/71 | HR 111 | Temp 97.6°F | Wt 199.4 lb

## 2020-01-29 DIAGNOSIS — Z3480 Encounter for supervision of other normal pregnancy, unspecified trimester: Secondary | ICD-10-CM

## 2020-01-29 DIAGNOSIS — Z3A39 39 weeks gestation of pregnancy: Secondary | ICD-10-CM

## 2020-01-29 DIAGNOSIS — R8271 Bacteriuria: Secondary | ICD-10-CM

## 2020-01-29 NOTE — Addendum Note (Signed)
Addended by: Gavin Pound L on: 01/29/2020 11:26 AM   Modules accepted: Orders, SmartSet

## 2020-01-29 NOTE — Patient Instructions (Signed)
Levonorgestrel intrauterine device (IUD) What is this medicine? LEVONORGESTREL IUD (LEE voe nor jes trel) is a contraceptive (birth control) device. The device is placed inside the uterus by a healthcare professional. It is used to prevent pregnancy. This device can also be used to treat heavy bleeding that occurs during your period. This medicine may be used for other purposes; ask your health care provider or pharmacist if you have questions. COMMON BRAND NAME(S): Kyleena, LILETTA, Mirena, Skyla What should I tell my health care provider before I take this medicine? They need to know if you have any of these conditions:  abnormal Pap smear  cancer of the breast, uterus, or cervix  diabetes  endometritis  genital or pelvic infection now or in the past  have more than one sexual partner or your partner has more than one partner  heart disease  history of an ectopic or tubal pregnancy  immune system problems  IUD in place  liver disease or tumor  problems with blood clots or take blood-thinners  seizures  use intravenous drugs  uterus of unusual shape  vaginal bleeding that has not been explained  an unusual or allergic reaction to levonorgestrel, other hormones, silicone, or polyethylene, medicines, foods, dyes, or preservatives  pregnant or trying to get pregnant  breast-feeding How should I use this medicine? This device is placed inside the uterus by a health care professional. Talk to your pediatrician regarding the use of this medicine in children. Special care may be needed. Overdosage: If you think you have taken too much of this medicine contact a poison control center or emergency room at once. NOTE: This medicine is only for you. Do not share this medicine with others. What if I miss a dose? This does not apply. Depending on the brand of device you have inserted, the device will need to be replaced every 3 to 6 years if you wish to continue using this type  of birth control. What may interact with this medicine? Do not take this medicine with any of the following medications:  amprenavir  bosentan  fosamprenavir This medicine may also interact with the following medications:  aprepitant  armodafinil  barbiturate medicines for inducing sleep or treating seizures  bexarotene  boceprevir  griseofulvin  medicines to treat seizures like carbamazepine, ethotoin, felbamate, oxcarbazepine, phenytoin, topiramate  modafinil  pioglitazone  rifabutin  rifampin  rifapentine  some medicines to treat HIV infection like atazanavir, efavirenz, indinavir, lopinavir, nelfinavir, tipranavir, ritonavir  St. John's wort  warfarin This list may not describe all possible interactions. Give your health care provider a list of all the medicines, herbs, non-prescription drugs, or dietary supplements you use. Also tell them if you smoke, drink alcohol, or use illegal drugs. Some items may interact with your medicine. What should I watch for while using this medicine? Visit your doctor or health care professional for regular check ups. See your doctor if you or your partner has sexual contact with others, becomes HIV positive, or gets a sexual transmitted disease. This product does not protect you against HIV infection (AIDS) or other sexually transmitted diseases. You can check the placement of the IUD yourself by reaching up to the top of your vagina with clean fingers to feel the threads. Do not pull on the threads. It is a good habit to check placement after each menstrual period. Call your doctor right away if you feel more of the IUD than just the threads or if you cannot feel the threads at   all. The IUD may come out by itself. You may become pregnant if the device comes out. If you notice that the IUD has come out use a backup birth control method like condoms and call your health care provider. Using tampons will not change the position of the  IUD and are okay to use during your period. This IUD can be safely scanned with magnetic resonance imaging (MRI) only under specific conditions. Before you have an MRI, tell your healthcare provider that you have an IUD in place, and which type of IUD you have in place. What side effects may I notice from receiving this medicine? Side effects that you should report to your doctor or health care professional as soon as possible:  allergic reactions like skin rash, itching or hives, swelling of the face, lips, or tongue  fever, flu-like symptoms  genital sores  high blood pressure  no menstrual period for 6 weeks during use  pain, swelling, warmth in the leg  pelvic pain or tenderness  severe or sudden headache  signs of pregnancy  stomach cramping  sudden shortness of breath  trouble with balance, talking, or walking  unusual vaginal bleeding, discharge  yellowing of the eyes or skin Side effects that usually do not require medical attention (report to your doctor or health care professional if they continue or are bothersome):  acne  breast pain  change in sex drive or performance  changes in weight  cramping, dizziness, or faintness while the device is being inserted  headache  irregular menstrual bleeding within first 3 to 6 months of use  nausea This list may not describe all possible side effects. Call your doctor for medical advice about side effects. You may report side effects to FDA at 1-800-FDA-1088. Where should I keep my medicine? This does not apply. NOTE: This sheet is a summary. It may not cover all possible information. If you have questions about this medicine, talk to your doctor, pharmacist, or health care provider.  2020 Elsevier/Gold Standard (2017-12-10 13:22:01)  

## 2020-01-29 NOTE — Progress Notes (Signed)
   LOW-RISK PREGNANCY OFFICE VISIT  [name] MRN [id number]  Date of birth: 1995/08/01 Chief Complaint:   Routine Prenatal Visit  Subjective:   Annette Glenn is a 24 y.o. G38P1001 female at [redacted]w[redacted]d with an Estimated Date of Delivery: 02/03/20 being seen today for ongoing management of a low-risk pregnancy aeb has Supervision of other normal pregnancy, antepartum; Obesity affecting pregnancy, antepartum; Pregnancy headache in second trimester; and Limited prenatal care, antepartum on their problem list.  Patient presents today with  no complaints. Patient endorses fetal movement and denies cramping or contractions.  Patient also denies vaginal concerns including abnormal discharge, leaking of fluid, and bleeding.  Contractions: Not present. Vag. Bleeding: None.  Movement: Present.  Reviewed past medical,surgical, social, obstetrical and family history as well as problem list, medications and allergies.  Objective   Vitals:   01/29/20 1050  BP: 108/71  Pulse: (!) 111  Temp: 97.6 F (36.4 C)  Weight: 199 lb 6.4 oz (90.4 kg)  Body mass index is 37.68 kg/m.  Total Weight Gain:4 lb 6.4 oz (1.996 kg)         Physical Examination:   General appearance: Well appearing, and in no distress  Mental status: Alert, oriented to person, place, and time  Skin: Warm & dry  Cardiovascular: Normal heart rate noted  Respiratory: Normal respiratory effort, no distress  Abdomen: Soft, gravid, nontender, LGA with Fundal height of Fundal Height: 42 cm  Pelvic: Cervical exam performed  Dilation: 3 Effacement (%): 50 Station: -2 Presentation: Vertex  Extremities: Edema: None  Fetal Status: Fetal Heart Rate (bpm): 140  Movement: Present   No results found for this or any previous visit (from the past 24 hour(s)).  Assessment & Plan:  Low-risk pregnancy of a 24 y.o., G2P1001 at [redacted]w[redacted]d with an Estimated Date of Delivery: 02/03/20   1. Supervision of other normal pregnancy,  antepartum -Anticipatory guidance for upcoming visits. -Instructed to schedule for PP visit today.  2. [redacted] weeks gestation of pregnancy -Discussed IOL per Dr. Harolyn Rutherford recommendation. -Patient requests to be scheduled in the AM to prepare. -Discussed induction with pitocin and epidural placement. -Also discussed anticipated LGA infant and SD.   3. GBS bacteriuria -Plan to treat in labor.     Meds: No orders of the defined types were placed in this encounter.  Labs/procedures today:  Lab Orders  No laboratory test(s) ordered today     Reviewed: Term labor symptoms and general obstetric precautions including but not limited to vaginal bleeding, contractions, leaking of fluid and fetal movement were reviewed in detail with the patient.  All questions were answered.  Follow-up: No follow-ups on file.  No orders of the defined types were placed in this encounter.  Maryann Conners MSN, CNM 01/29/2020

## 2020-01-30 ENCOUNTER — Encounter (HOSPITAL_COMMUNITY): Payer: Self-pay | Admitting: Obstetrics & Gynecology

## 2020-01-30 ENCOUNTER — Inpatient Hospital Stay (HOSPITAL_COMMUNITY): Payer: Medicaid Other | Admitting: Anesthesiology

## 2020-01-30 ENCOUNTER — Other Ambulatory Visit: Payer: Self-pay

## 2020-01-30 ENCOUNTER — Inpatient Hospital Stay (HOSPITAL_COMMUNITY): Payer: Medicaid Other

## 2020-01-30 ENCOUNTER — Inpatient Hospital Stay (HOSPITAL_COMMUNITY)
Admission: AD | Admit: 2020-01-30 | Discharge: 2020-02-01 | DRG: 807 | Disposition: A | Discharge status: Home / Self Care | Payer: Medicaid Other | Attending: Obstetrics & Gynecology | Admitting: Obstetrics & Gynecology

## 2020-01-30 DIAGNOSIS — O0930 Supervision of pregnancy with insufficient antenatal care, unspecified trimester: Secondary | ICD-10-CM

## 2020-01-30 DIAGNOSIS — O99824 Streptococcus B carrier state complicating childbirth: Secondary | ICD-10-CM | POA: Diagnosis not present

## 2020-01-30 DIAGNOSIS — Z3A39 39 weeks gestation of pregnancy: Secondary | ICD-10-CM

## 2020-01-30 DIAGNOSIS — O99214 Obesity complicating childbirth: Secondary | ICD-10-CM | POA: Diagnosis present

## 2020-01-30 DIAGNOSIS — R519 Headache, unspecified: Secondary | ICD-10-CM | POA: Diagnosis present

## 2020-01-30 DIAGNOSIS — J45909 Unspecified asthma, uncomplicated: Secondary | ICD-10-CM | POA: Diagnosis not present

## 2020-01-30 DIAGNOSIS — O99210 Obesity complicating pregnancy, unspecified trimester: Secondary | ICD-10-CM | POA: Diagnosis present

## 2020-01-30 DIAGNOSIS — O9952 Diseases of the respiratory system complicating childbirth: Secondary | ICD-10-CM | POA: Diagnosis not present

## 2020-01-30 DIAGNOSIS — Z30430 Encounter for insertion of intrauterine contraceptive device: Secondary | ICD-10-CM | POA: Diagnosis not present

## 2020-01-30 DIAGNOSIS — Z3480 Encounter for supervision of other normal pregnancy, unspecified trimester: Secondary | ICD-10-CM

## 2020-01-30 DIAGNOSIS — Z20822 Contact with and (suspected) exposure to covid-19: Secondary | ICD-10-CM | POA: Diagnosis not present

## 2020-01-30 DIAGNOSIS — O26893 Other specified pregnancy related conditions, third trimester: Secondary | ICD-10-CM | POA: Diagnosis not present

## 2020-01-30 DIAGNOSIS — O26892 Other specified pregnancy related conditions, second trimester: Secondary | ICD-10-CM | POA: Diagnosis present

## 2020-01-30 LAB — CBC
HCT: 36.8 % (ref 36.0–46.0)
Hemoglobin: 11.7 g/dL — ABNORMAL LOW (ref 12.0–15.0)
MCH: 27.1 pg (ref 26.0–34.0)
MCHC: 31.8 g/dL (ref 30.0–36.0)
MCV: 85.2 fL (ref 80.0–100.0)
Platelets: 208 10*3/uL (ref 150–400)
RBC: 4.32 MIL/uL (ref 3.87–5.11)
RDW: 14.6 % (ref 11.5–15.5)
WBC: 11.4 10*3/uL — ABNORMAL HIGH (ref 4.0–10.5)
nRBC: 0 % (ref 0.0–0.2)

## 2020-01-30 LAB — RESP PANEL BY RT-PCR (FLU A&B, COVID) ARPGX2
Influenza A by PCR: NEGATIVE
Influenza B by PCR: NEGATIVE
SARS Coronavirus 2 by RT PCR: NEGATIVE

## 2020-01-30 LAB — TYPE AND SCREEN
ABO/RH(D): O POS
Antibody Screen: NEGATIVE

## 2020-01-30 MED ORDER — PHENYLEPHRINE 40 MCG/ML (10ML) SYRINGE FOR IV PUSH (FOR BLOOD PRESSURE SUPPORT): 80.0000 ug | PREFILLED_SYRINGE | INTRAVENOUS | Status: DC | PRN

## 2020-01-30 MED ORDER — ACETAMINOPHEN 325 MG PO TABS: 650.0000 mg | ORAL_TABLET | ORAL | Status: DC | PRN

## 2020-01-30 MED ORDER — EPHEDRINE 5 MG/ML INJ: 10.0000 mg | INTRAVENOUS | Status: DC | PRN

## 2020-01-30 MED ORDER — LACTATED RINGERS IV SOLN: INTRAVENOUS | Status: DC

## 2020-01-30 MED ORDER — PARAGARD INTRAUTERINE COPPER IU IUD: INTRAUTERINE_SYSTEM | Freq: Once | INTRAUTERINE | Status: DC

## 2020-01-30 MED ORDER — PENICILLIN G POT IN DEXTROSE 60000 UNIT/ML IV SOLN
3.0000 10*6.[IU] | INTRAVENOUS | Status: DC
  Administered 2020-01-30 – 2020-01-31 (×2): 3 10*6.[IU] via INTRAVENOUS
  Filled 2020-01-30 (×2): qty 50

## 2020-01-30 MED ORDER — LACTATED RINGERS IV SOLN: 500.0000 mL | INTRAVENOUS | Status: DC | PRN

## 2020-01-30 MED ORDER — OXYTOCIN-SODIUM CHLORIDE 30-0.9 UT/500ML-% IV SOLN
INTRAVENOUS | Status: AC
  Administered 2020-01-30: 19:00:00 2 m[IU]/min via INTRAVENOUS
  Filled 2020-01-30: qty 500

## 2020-01-30 MED ORDER — TERBUTALINE SULFATE 1 MG/ML IJ SOLN: 0.2500 mg | Freq: Once | INTRAMUSCULAR | Status: DC | PRN

## 2020-01-30 MED ORDER — FENTANYL CITRATE (PF) 100 MCG/2ML IJ SOLN: 50.0000 ug | INTRAMUSCULAR | Status: DC | PRN

## 2020-01-30 MED ORDER — ONDANSETRON HCL 4 MG/2ML IJ SOLN: 4.0000 mg | Freq: Four times a day (QID) | INTRAMUSCULAR | Status: DC | PRN

## 2020-01-30 MED ORDER — OXYTOCIN-SODIUM CHLORIDE 30-0.9 UT/500ML-% IV SOLN: 1.0000 m[IU]/min | INTRAVENOUS | Status: DC

## 2020-01-30 MED ORDER — FENTANYL-BUPIVACAINE-NACL 0.5-0.125-0.9 MG/250ML-% EP SOLN
12.0000 mL/h | EPIDURAL | Status: DC | PRN
Start: 2020-01-30 — End: 2020-01-31
  Filled 2020-01-30: qty 250

## 2020-01-30 MED ORDER — OXYTOCIN BOLUS FROM INFUSION
333.0000 mL | Freq: Once | INTRAVENOUS | Status: AC
  Administered 2020-01-31: 05:00:00 333 mL via INTRAVENOUS

## 2020-01-30 MED ORDER — CALCIUM CARBONATE ANTACID 500 MG PO CHEW
1.0000 | CHEWABLE_TABLET | Freq: Four times a day (QID) | ORAL | Status: DC | PRN
  Administered 2020-01-30: 23:00:00 200 mg via ORAL
  Filled 2020-01-30: qty 1

## 2020-01-30 MED ORDER — PHENYLEPHRINE 40 MCG/ML (10ML) SYRINGE FOR IV PUSH (FOR BLOOD PRESSURE SUPPORT)
80.0000 ug | PREFILLED_SYRINGE | INTRAVENOUS | Status: DC | PRN
  Filled 2020-01-30: qty 10

## 2020-01-30 MED ORDER — OXYTOCIN-SODIUM CHLORIDE 30-0.9 UT/500ML-% IV SOLN: 2.5000 [IU]/h | INTRAVENOUS | Status: DC

## 2020-01-30 MED ORDER — LEVONORGESTREL 19.5 MCG/DAY IU IUD
INTRAUTERINE_SYSTEM | Freq: Once | INTRAUTERINE | Status: AC
  Administered 2020-01-31: 05:00:00 1 via INTRAUTERINE
  Filled 2020-01-30: qty 1

## 2020-01-30 MED ORDER — SODIUM CHLORIDE 0.9 % IV SOLN
5.0000 10*6.[IU] | Freq: Once | INTRAVENOUS | Status: AC
  Administered 2020-01-30: 18:00:00 5 10*6.[IU] via INTRAVENOUS
  Filled 2020-01-30: qty 5

## 2020-01-30 MED ORDER — DIPHENHYDRAMINE HCL 50 MG/ML IJ SOLN: 12.5000 mg | INTRAMUSCULAR | Status: DC | PRN

## 2020-01-30 MED ORDER — LACTATED RINGERS IV SOLN: 500.0000 mL | Freq: Once | INTRAVENOUS | Status: DC

## 2020-01-30 MED ORDER — LIDOCAINE HCL (PF) 1 % IJ SOLN: 30.0000 mL | INTRAMUSCULAR | Status: DC | PRN

## 2020-01-30 MED ORDER — SOD CITRATE-CITRIC ACID 500-334 MG/5ML PO SOLN
30.0000 mL | ORAL | Status: DC | PRN
Start: 2020-01-30 — End: 2020-01-31

## 2020-01-30 NOTE — Anesthesia Procedure Notes (Signed)
Epidural Patient location during procedure: OB Start time: 01/30/2020 11:45 PM End time: 01/30/2020 11:50 PM  Staffing Anesthesiologist: Darral Dash, DO Performed: anesthesiologist   Preanesthetic Checklist Completed: patient identified, IV checked, site marked, risks and benefits discussed, surgical consent, monitors and equipment checked, pre-op evaluation and timeout performed  Epidural Patient position: sitting Prep: ChloraPrep Patient monitoring: heart rate, continuous pulse ox and blood pressure Approach: midline Location: L3-L4 Injection technique: LOR saline  Needle:  Needle type: Tuohy  Needle gauge: 17 G Needle length: 9 cm Needle insertion depth: 6 cm Catheter type: closed end flexible Catheter size: 20 Guage Catheter at skin depth: 11 cm Test dose: negative and 1.5% lidocaine  Assessment Events: blood not aspirated, injection not painful, no injection resistance and no paresthesia  Additional Notes Patient identified. Risks/Benefits/Options discussed with patient including but not limited to bleeding, infection, nerve damage, paralysis, failed block, incomplete pain control, headache, blood pressure changes, nausea, vomiting, reactions to medications, itching and postpartum back pain. Confirmed with bedside nurse the patient's most recent platelet count. Confirmed with patient that they are not currently taking any anticoagulation, have any bleeding history or any family history of bleeding disorders. Patient expressed understanding and wished to proceed. All questions were answered. Sterile technique was used throughout the entire procedure. Please see nursing notes for vital signs. Test dose was given through epidural catheter and negative prior to continuing to dose epidural or start infusion. Warning signs of high block given to the patient including shortness of breath, tingling/numbness in hands, complete motor block, or any concerning symptoms with  instructions to call for help. Patient was given instructions on fall risk and not to get out of bed. All questions and concerns addressed with instructions to call with any issues or inadequate analgesia.    Reason for block:procedure for pain

## 2020-01-30 NOTE — Progress Notes (Signed)
Labor Progress Note Annette Glenn is a 24 y.o. G2P1001 at [redacted]w[redacted]d presented for eIOL in the setting of previously LGA infant by ultrasound.   S: No concerns per patient. Minimal discomfort with contractions but desires epidural prior to AROM.  O:  BP 109/65   Pulse 93   Temp 98.5 F (36.9 C) (Oral)   Resp 16   Ht 5\' 5"  (1.651 m)   Wt 90.9 kg   LMP 04/29/2019 (Exact Date)   BMI 33.33 kg/m  EFM: baseline 130/moderate variability/+accels, no decels  CVE: Dilation: 5 Effacement (%): 50 Station: -1 Presentation: Vertex Exam by:: Dr Astrid Drafts   A&P: 24 y.o. G2P1001 [redacted]w[redacted]d presented for eIOL in the setting of previously LGA infant by ultrasound.  #Labor: Progressing well. Pt desires epidural at this time. Will plan to continue up-titration of pitocin and AROM s/p epidural in place. #Pain: plan for epidural  #FWB: Category 1 strip #GBS positive #Previously LGA Infant: most recently on 12/15, infant noted to be grown to 3754g with EFW 77%ile and AC >99%ile.  #Limited PNC: plan for SW consult in postpartum period.  Randa Ngo, MD 11:32 PM

## 2020-01-30 NOTE — H&P (Addendum)
OBSTETRIC ADMISSION HISTORY AND PHYSICAL  Annette Glenn is a 24 y.o. female G2P1001 with IUP at [redacted]w[redacted]d by LMP presenting for eIOL, previously LGA, now resolved. She reports +FMs, No LOF, no VB, no blurry vision, headaches or peripheral edema, and RUQ pain.  She plans on breast feeding. She request post placental paragard for birth control. She received her prenatal care at [facility]   Dating: By LMP --->  Estimated Date of Delivery: 02/03/20  Sono:    01/27/20@[redacted]w[redacted]d , CWD, normal anatomy, cephalic presentation, 8850Y, 77% EFW   Prenatal History/Complications:  GBS positive Limited prenatal care (no care June-Oct) BMI 38   Past Medical History: Past Medical History:  Diagnosis Date  . Asthma   . Cancer ([place])   . History of chemotherapy    [id number]--Carboplatin, Cyclophosphamide, Doxorubicin, Etoposide  . 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 Hx of eosinophilia   . 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 Pneumonia   . Post term pregnancy over 40 weeks 06/26/2018    Past Surgical History: Past Surgical History:  Procedure Laterality Date  . Broviac Catheter removal  09/1998  . Femoral Line Placement    . 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 Multiple Laparoscopic liver biopsies    . Open Liver Biopsy     Broviac Catheter placement    Obstetrical History: OB History    Gravida  2   Para  1   Term  1   Preterm      AB      Living  1     SAB      IAB      Ectopic      Multiple  0   Live Births  1           Social History Social History   Socioeconomic History  . Marital status: Single    Spouse name: Not on file  . Number of children: 1  . Years of education: [employer]  . Highest education level: 12th grade  Occupational History    Comment: [employer]  Tobacco Use  . Smoking status: Never Smoker  . Smokeless tobacco: Never Used  Vaping Use  . Vaping Use: Never used  Substance and Sexual Activity  . Alcohol use: Never  .  Drug use: Never  . Sexual activity: Yes    Birth control/protection: None  Other Topics Concern  . Not on file  Social History Narrative  . Not on file   Social Determinants of Health   Financial Resource Strain: Not on file  Food Insecurity: Not on file  Transportation Needs: Not on file  Physical Activity: Not on file  Stress: Not on file  Social Connections: Not on file    Family History: Family History  Problem Relation Age of Onset  . Diabetes Father     Allergies: No Known Allergies  Medications Prior to Admission  Medication Sig Dispense Refill Last Dose  . Prenatal MV-Min-FA-Omega-3 (PRENATAL GUMMIES/DHA & FA) 0.4-32.5 MG CHEW Chew 3 each by mouth daily. 90 tablet 12 Past Week at Unknown time  . terconazole (TERAZOL 7) 0.4 % vaginal cream Place 1 applicator vaginally at bedtime. For 3 days. 45 g 0 Past Month at Unknown time     Review of Systems   All systems reviewed and negative except as stated in HPI  Last menstrual period 04/29/2019, not currently breastfeeding. General appearance: alert, cooperative and no distress Lungs: normal respiratory effort  Heart: regular rate and rhythm Abdomen: soft, non-tender; gravid Pelvic: as noted below Extremities: Homans sign is negative, no sign of DVT Presentation: cephalic by BSUS Fetal monitoringBaseline: 150 bpm, Variability: Good {> 6 bpm), Accelerations: Reactive and Decelerations: Absent Uterine activity intermittent Dilation: 4 Effacement (%): 50 Station: -2 Exam by:: Annette Glenn   Prenatal labs: ABO, Rh: --/--/PENDING (12/18 1757) Antibody: PENDING (12/18 1757) Rubella: 1.99 (06/17 1014) RPR: Non Reactive (10/05 0852)  HBsAg: Negative (06/17 1014)  HIV: Non Reactive (10/05 0852)  GBS: Positive/-- (11/24 1626)  2 hr Glucola normal Genetic screening normal Anatomy US limited, f/u LGA, now resolved  Prenatal Transfer Tool  Maternal Diabetes: No Genetic Screening: Normal Maternal  Ultrasounds/Referrals: Normal Fetal Ultrasounds or other Referrals:  None Maternal Substance Abuse:  No Significant Maternal Medications:  None Significant Maternal Lab Results: Group B Strep positive  Results for orders placed or performed during the hospital encounter of 01/30/20 (from the past 24 hour(s))  Type and screen   Collection Time: 01/30/20  5:57 PM  Result Value Ref Range   ABO/RH(D) PENDING    Antibody Screen PENDING    Sample Expiration      02/02/2020,2359 Performed at [facility], [address], [place], [place] [address]     Patient Active Problem List   Diagnosis Date Noted  . Indication for care or intervention in labor or delivery 01/30/2020  . Limited prenatal care, antepartum 12/02/2019  . Obesity affecting pregnancy, antepartum 07/30/2019  . Pregnancy headache in second trimester 07/30/2019  . Supervision of other normal pregnancy, antepartum 06/08/2019    Assessment/Plan:  KARIA EHRESMAN is a 24 y.o. G2P1001 at [redacted]w[redacted]d here for eIOL, previously LGA, now resolved.  #IOL: Discussed induction process with patient. Given cervical exam will initiate pitocin at this time. #Pain: PRN #FWB: Cat 1 #ID: GBS positive, PCN ordered #MOF: breast #MOC:post placental liletta, ordered and consented, forms signed #Circ: yes #Limited PNC: lapse in care from [place], SW consult pp  Arrie Senate, MD  01/30/2020, 6:20 PM

## 2020-01-31 ENCOUNTER — Encounter (HOSPITAL_COMMUNITY): Payer: Self-pay | Admitting: Obstetrics & Gynecology

## 2020-01-31 DIAGNOSIS — Z3A39 39 weeks gestation of pregnancy: Secondary | ICD-10-CM

## 2020-01-31 DIAGNOSIS — O99824 Streptococcus B carrier state complicating childbirth: Secondary | ICD-10-CM

## 2020-01-31 DIAGNOSIS — Z30430 Encounter for insertion of intrauterine contraceptive device: Secondary | ICD-10-CM

## 2020-01-31 LAB — RPR: RPR Ser Ql: NONREACTIVE

## 2020-01-31 MED ORDER — BENZOCAINE-MENTHOL 20-0.5 % EX AERO: 1.0000 "application " | INHALATION_SPRAY | CUTANEOUS | Status: DC | PRN

## 2020-01-31 MED ORDER — DIBUCAINE (PERIANAL) 1 % EX OINT: 1.0000 "application " | TOPICAL_OINTMENT | CUTANEOUS | Status: DC | PRN

## 2020-01-31 MED ORDER — IBUPROFEN 600 MG PO TABS
600.0000 mg | ORAL_TABLET | Freq: Four times a day (QID) | ORAL | Status: DC
  Administered 2020-01-31 – 2020-02-01 (×4): 600 mg via ORAL
  Filled 2020-01-31 (×5): qty 1

## 2020-01-31 MED ORDER — ACETAMINOPHEN 325 MG PO TABS
650.0000 mg | ORAL_TABLET | Freq: Four times a day (QID) | ORAL | Status: DC
  Administered 2020-01-31 – 2020-02-01 (×3): 650 mg via ORAL
  Filled 2020-01-31 (×4): qty 2

## 2020-01-31 MED ORDER — SODIUM CHLORIDE (PF) 0.9 % IJ SOLN
INTRAMUSCULAR | Status: DC | PRN
  Administered 2020-01-30: 12 mL/h via EPIDURAL

## 2020-01-31 MED ORDER — SENNOSIDES-DOCUSATE SODIUM 8.6-50 MG PO TABS
2.0000 | ORAL_TABLET | Freq: Every day | ORAL | Status: DC
  Administered 2020-02-01: 09:00:00 2 via ORAL
  Filled 2020-01-31: qty 2

## 2020-01-31 MED ORDER — LIDOCAINE HCL (PF) 1 % IJ SOLN
INTRAMUSCULAR | Status: DC | PRN
  Administered 2020-01-30: 7 mL via EPIDURAL

## 2020-01-31 MED ORDER — DIPHENHYDRAMINE HCL 25 MG PO CAPS: 25.0000 mg | ORAL_CAPSULE | Freq: Four times a day (QID) | ORAL | Status: DC | PRN

## 2020-01-31 MED ORDER — ONDANSETRON HCL 4 MG PO TABS: 4.0000 mg | ORAL_TABLET | ORAL | Status: DC | PRN

## 2020-01-31 MED ORDER — PRENATAL MULTIVITAMIN CH
1.0000 | ORAL_TABLET | Freq: Every day | ORAL | Status: DC
  Administered 2020-01-31: 13:00:00 1 via ORAL
  Filled 2020-01-31 (×2): qty 1

## 2020-01-31 MED ORDER — ONDANSETRON HCL 4 MG/2ML IJ SOLN: 4.0000 mg | INTRAMUSCULAR | Status: DC | PRN

## 2020-01-31 MED ORDER — SIMETHICONE 80 MG PO CHEW: 80.0000 mg | CHEWABLE_TABLET | ORAL | Status: DC | PRN

## 2020-01-31 MED ORDER — WITCH HAZEL-GLYCERIN EX PADS: 1.0000 "application " | MEDICATED_PAD | CUTANEOUS | Status: DC | PRN

## 2020-01-31 MED ORDER — COCONUT OIL OIL: 1.0000 "application " | TOPICAL_OIL | Status: DC | PRN

## 2020-01-31 MED ORDER — LACTATED RINGERS AMNIOINFUSION: INTRAVENOUS | Status: DC

## 2020-01-31 MED ORDER — TETANUS-DIPHTH-ACELL PERTUSSIS 5-2.5-18.5 LF-MCG/0.5 IM SUSY: 0.5000 mL | PREFILLED_SYRINGE | Freq: Once | INTRAMUSCULAR | Status: DC

## 2020-01-31 NOTE — Anesthesia Preprocedure Evaluation (Signed)
Anesthesia Evaluation  Patient identified by MRN, date of birth, ID band Patient awake    Reviewed: Allergy & Precautions, NPO status , Patient's Chart, lab work & pertinent test results  Airway Mallampati: II  TM Distance: >3 FB Neck ROM: Full    Dental  (+) Teeth Intact   Pulmonary asthma ,    Pulmonary exam normal        Cardiovascular negative cardio ROS   Rhythm:Regular Rate:Normal     Neuro/Psych  Headaches, negative psych ROS   GI/Hepatic negative GI ROS, Heterogenous liver   Endo/Other  negative endocrine ROS  Renal/GU   negative genitourinary   Musculoskeletal negative musculoskeletal ROS (+)   Abdominal (+)  Abdomen: soft. Bowel sounds: normal.  Peds  Hematology negative hematology ROS (+)   Anesthesia Other Findings   Reproductive/Obstetrics (+) Pregnancy                             Anesthesia Physical Anesthesia Plan  ASA: II  Anesthesia Plan: Epidural   Post-op Pain Management:    Induction:   PONV Risk Score and Plan: 2  Airway Management Planned: Natural Airway  Additional Equipment: None  Intra-op Plan:   Post-operative Plan:   Informed Consent: I have reviewed the patients History and Physical, chart, labs and discussed the procedure including the risks, benefits and alternatives for the proposed anesthesia with the patient or authorized representative who has indicated his/her understanding and acceptance.     Dental advisory given  Plan Discussed with:   Anesthesia Plan Comments: (Lab Results      Component                Value               Date                      WBC                      11.4 (H)            01/30/2020                HGB                      11.7 (L)            01/30/2020                HCT                      36.8                01/30/2020                MCV                      85.2                01/30/2020                 PLT                      208                 01/30/2020          )  Anesthesia Quick Evaluation  

## 2020-01-31 NOTE — Progress Notes (Signed)
Labor Progress Note Annette Glenn is a 23 y.o. G2P1001 at [redacted]w[redacted]d presented for eIOL in the setting of previously LGA infant by ultrasound.   S: Pt resting comfortably with epidural in place.  O:  BP 118/78   Pulse 83   Temp 98.4 F (36.9 C) (Oral)   Resp 16   Ht 5\' 5"  (1.651 m)   Wt 90.9 kg   LMP 04/29/2019 (Exact Date)   SpO2 99%   BMI 33.33 kg/m  EFM: baseline 130/moderate variability/+accels, no decels Toco: ctx q2-4 min  CVE: Dilation: 6 Effacement (%): 90 Cervical Position: Anterior Station: 0 Presentation: Vertex Exam by:: Dr Astrid Drafts   A&P: 24 y.o. G2P1001 [redacted]w[redacted]d presented for eIOL in the setting of previously LGA infant by ultrasound.  #Labor: Progressing well. Good fetal tolerance with up-titration of pitocin. Now s/p AROM for clear fluid at 0214. Will plan to recheck cervical exam in 2 hours or sooner as clinically indicated. #Pain: epidural in place #FWB: Category 1 strip #GBS positive, now s/p adequate antibiotics #Previously LGA Infant: most recently on 12/15, infant noted to be grown to 3754g with EFW 77%ile and AC >99%ile.  #Limited PNC: plan for SW consult in postpartum period.  Randa Ngo, MD OB Fellow, [facility] 01/31/2020 2:17 AM

## 2020-01-31 NOTE — Progress Notes (Signed)
CSW received consult for late and limited PNC.  CSW reviewed chart and is screening out consult as it does not meet criteria for automatic CSW involvement and infant drug screening. MOB started care prior to 28 weeks( MOB started at 13 weeks and 1 day per chart review) with visits as follows (07/30/19, 12/02/19, 01/06/20, 01/22/20, 01/29/20) and had more than 3 visits.  Please contact CSW if current concerns arise or by MOB's request.   Annette Glenn. Annette Glenn, MSW, LCSW [facility] [place] [facility] [place] [phone]

## 2020-01-31 NOTE — Anesthesia Postprocedure Evaluation (Signed)
Anesthesia Post Note  Patient: Annette Glenn  Procedure(s) Performed: AN AD HOC LABOR EPIDURAL     Patient location during evaluation: Mother Baby Anesthesia Type: Epidural Level of consciousness: awake and alert Pain management: pain level controlled Vital Signs Assessment: post-procedure vital signs reviewed and stable Respiratory status: spontaneous breathing, nonlabored ventilation and respiratory function stable Cardiovascular status: stable Postop Assessment: no headache, no backache and epidural receding Anesthetic complications: no   No complications documented.  Last Vitals:  Vitals:   01/31/20 1255 01/31/20 1620  BP: 112/65 111/69  Pulse: 86 79  Resp: 18 18  Temp: 36.6 C 37 C  SpO2: 98%     Last Pain:  Vitals:   01/31/20 1621  TempSrc:   PainSc: 0-No pain   Pain Goal:                   Errik Mitchelle

## 2020-01-31 NOTE — Discharge Summary (Signed)
Postpartum Discharge Summary  Date of Service updated 02/01/20     [name] DOB: [birth year] MRN: [id number]  Date of admission: 01/30/2020 Delivery date:01/31/2020  Delivering provider: Randa Ngo  Date of discharge: 02/01/2020  Admitting diagnosis: Indication for care or intervention in labor or delivery [O75.9] Intrauterine pregnancy: [redacted]w[redacted]d    Secondary diagnosis:  Principal Problem:   Vaginal delivery Active Problems:   Supervision of other normal pregnancy, antepartum   Obesity affecting pregnancy, antepartum   Pregnancy headache in second trimester   Limited prenatal care, antepartum   Indication for care or intervention in labor or delivery   Encounter for IUD insertion  Additional problems: none    Discharge diagnosis: Term Pregnancy Delivered                                              Post partum procedures:post placental liletta placed Augmentation: AROM and Pitocin Complications: None  Hospital course: Induction of Labor With Vaginal Delivery   24y.o. yo [id number] admitted to the hospital 01/30/2020 for induction of labor.  Indication for induction: Elective.  Patient had an uncomplicated labor course as follows: Membrane Rupture Time/Date: 2:12 AM ,01/31/2020   Delivery Method:Vaginal, Spontaneous  Episiotomy: None  Lacerations:  Periurethral  Details of delivery can be found in separate delivery note.  Patient had a routine postpartum course. Patient is discharged home 02/01/20.  Newborn Data: Birth date:01/31/2020  Birth time:5:14 AM  Gender:Female  Living status:Living  Apgars:8 ,9  Weight:3541 g   Magnesium Sulfate received: No BMZ received: No Rhophylac:N/A MMR:N/A T-DaP:declined Flu: No Transfusion:No  Physical exam  Vitals:   01/31/20 1255 01/31/20 1620 01/31/20 2020 02/01/20 0530  BP: 112/65 111/69 122/82 (!) 124/93  Pulse: 86 79 77 69  Resp: 18 18 16 16   Temp: 97.8 F (36.6 C) 98.6 F (37  C) 98.6 F (37 C) 98 F (36.7 C)  TempSrc: Oral Oral Oral Oral  SpO2: 98%  98% 100%  Weight:      Height:       General: alert, cooperative and no distress Lochia: appropriate Uterine Fundus: firm Incision: N/A DVT Evaluation: No evidence of DVT seen on physical exam. Labs: Lab Results  Component Value Date   WBC 11.4 (H) 01/30/2020   HGB 11.7 (L) 01/30/2020   HCT 36.8 01/30/2020   MCV 85.2 01/30/2020   PLT 208 01/30/2020   CMP Latest Ref Rng & Units 07/30/2019  Glucose 65 - 99 mg/dL 80  Total Protein - -  Total Bilirubin - -  Alkaline Phos - -  AST - -  ALT - -   Edinburgh Score: Edinburgh Postnatal Depression Scale Screening Tool 08/20/2018  I have been able to laugh and see the funny side of things. 0  I have looked forward with enjoyment to things. 0  I have blamed myself unnecessarily when things went wrong. 0  I have been anxious or worried for no good reason. 0  I have felt scared or panicky for no good reason. 0  Things have been getting on top of me. 0  I have been so unhappy that I have had difficulty sleeping. 0  I have felt sad or miserable. 0  I have been so unhappy that I have been crying. 0  The thought of harming myself has  occurred to me. 0  Edinburgh Postnatal Depression Scale Total 0     After visit meds:  Allergies as of 02/01/2020   No Known Allergies     Medication List    STOP taking these medications   terconazole 0.4 % vaginal cream Commonly known as: TERAZOL 7     TAKE these medications   acetaminophen 500 MG tablet Commonly known as: TYLENOL Take 2 tablets (1,000 mg total) by mouth every 6 (six) hours as needed.   coconut oil Oil Apply 1 application topically as needed.   ibuprofen 600 MG tablet Commonly known as: ADVIL Take 1 tablet (600 mg total) by mouth every 6 (six) hours.   Prenatal Gummies/DHA & FA 0.4-32.5 MG Chew Chew 3 each by mouth daily.        Discharge home in stable condition Infant Feeding:  Breast Infant Disposition:home with mother Discharge instruction: per After Visit Summary and Postpartum booklet. Activity: Advance as tolerated. Pelvic rest for 6 weeks.  Diet: routine diet Future Appointments:No future appointments. Follow up Visit: Message sent to [facility] 02/01/20 by Sylvester Harder.   Please schedule this patient for a In person postpartum visit in 4 weeks with the following provider: Any provider. Additional Postpartum F/U:none  Low risk pregnancy complicated by: n/a Delivery mode:  Vaginal, Spontaneous  Anticipated Birth Control:  PP IUD placed, string check at postpartum visit   05/03/2246 Arrie Senate, MD

## 2020-01-31 NOTE — Discharge Instructions (Signed)

## 2020-02-01 MED ORDER — ACETAMINOPHEN 500 MG PO TABS: 1000.0000 mg | ORAL_TABLET | Freq: Four times a day (QID) | ORAL | Status: AC | PRN

## 2020-02-01 MED ORDER — COCONUT OIL OIL: 1.0000 "application " | TOPICAL_OIL | 0 refills | Status: AC | PRN

## 2020-02-01 MED ORDER — IBUPROFEN 600 MG PO TABS: 600.0000 mg | ORAL_TABLET | Freq: Four times a day (QID) | ORAL | 0 refills | Status: AC

## 2020-02-01 NOTE — Social Work (Signed)
CSW acknowledges re-consult for limited prenatal care. See previous CSW note. Referral previously screen out, as it does not meet criteria for automatic CSW involvement & infant drug screening.   Darra Lis, MSW, [place] [facility] [employer] [facility] [employer] [phone]

## 2020-03-11 ENCOUNTER — Ambulatory Visit (INDEPENDENT_AMBULATORY_CARE_PROVIDER_SITE_OTHER): Payer: Medicaid Other

## 2020-03-11 ENCOUNTER — Other Ambulatory Visit: Payer: Self-pay

## 2020-03-11 NOTE — Progress Notes (Signed)
[place] Partum Visit Note  Annette Glenn is a 25 y.o. G40P2002 female who presents for a postpartum visit. She is 5 weeks postpartum following a normal spontaneous vaginal delivery.  I have fully reviewed the prenatal and intrapartum course. The delivery was at 39.4 gestational weeks.  Anesthesia: epidural. Postpartum course has been uncomplicated. Baby is doing well. Baby is feeding by bottle - Gerber Gentle. Bleeding no bleeding. Bowel function is normal. Bladder function is normal. Patient is sexually active. Contraception method is IUD. Postpartum depression screening: negative.  Patient reports she is doing well.  She receives support from her FOB after he gets off work.  She does have family in the area.  She denies feelings of depression and endorses safety at home.  She denies DV/A.  She states she received an IUD after delivery and palpated the strings daily for 3-4 days.  She states she noticed the strings were getting longer and then just disappeared.  She states that she thinks the IUD fell out, but she can not verify this. Patient is using condoms.   The pregnancy intention screening data noted above was reviewed. Potential methods of contraception were discussed. The patient elected to proceed with Unknown/Not Reported.    Edinburgh Postnatal Depression Scale - 03/11/20 1104      Edinburgh Postnatal Depression Scale:  In the Past 7 Days   I have been able to laugh and see the funny side of things. 0    I have looked forward with enjoyment to things. 0    I have blamed myself unnecessarily when things went wrong. 0    I have been anxious or worried for no good reason. 0    I have felt scared or panicky for no good reason. 0    Things have been getting on top of me. 0    I have been so unhappy that I have had difficulty sleeping. 0    I have felt sad or miserable. 0    I have been so unhappy that I have been crying. 0    The thought of harming myself has occurred to me. 0     Edinburgh Postnatal Depression Scale Total 0            The following portions of the patient's history were reviewed and updated as appropriate: allergies, current medications, past family history, past medical history, past social history, past surgical history and problem list.  Review of Systems Pertinent items noted in HPI and remainder of comprehensive ROS otherwise negative.    Objective:  BP 114/79 (BP Location: Left Arm, Patient Position: Sitting, Cuff Size: Large)   Pulse 83   Temp 98.2 F (36.8 C) (Oral)   Ht 5\' 2"  (1.575 m)   Wt 184 lb 9.6 oz (83.7 kg)   LMP 04/29/2019 (Exact Date)   Breastfeeding No   BMI 33.76 kg/m    General:  alert, cooperative and no distress   Breasts:  Not Assessed  Lungs: clear to auscultation bilaterally  Heart:  regular rate and rhythm  Abdomen: soft, non-tender; bowel sounds normal; no masses,  no organomegaly   Vulva:  not evaluated  Vagina: not evaluated  Cervix:  Not Evaluated  Corpus: not examined  Adnexa:  not evaluated  Rectal Exam: Not performed.        Assessment:   4 Week postpartum exam.  Normal Involution IUD Placement Not Confirmed Pap smear June 2021   Plan:  -Patient declines pelvic exam,  but feels strongly that IUD was expelled.  -Discussed other birth control options. -Patient informed that IUD status could not be properly assessed without at least a pelvic exam. -Encouraged continued usage of condoms until IUD status could be confirmed and/or other birth control method implemented. -Patient without questions or concerns.   Essential components of care per ACOG recommendations:  1.  Mood and well being: Patient with negative depression screening today. Reviewed local resources for support.  - Patient does not use tobacco. - hx of drug use? No    2. Infant care and feeding:  -Patient currently breastmilk feeding? No -Social determinants of health (SDOH) reviewed in EPIC. No concerns *Has access to  food *Has transportation  3. Sexuality, contraception and birth spacing - Patient does not know want a pregnancy in the next year.  Desired family size is 3 children.  - Reviewed forms of contraception in tiered fashion. Patient has IUD in place.   - Discussed birth spacing of 18 months  4. Sleep and fatigue -Encouraged family/partner/community support of 4 hrs of uninterrupted sleep to help with mood and fatigue  5. Physical Recovery  - Discussed patients delivery and complications  - Patient had no degree laceration, perineal healing reviewed. Patient expressed understanding - Patient has urinary incontinence? No - Patient is safe to resume physical and sexual activity  6.  Health Maintenance - Last pap smear done June 2021 and was normal with negative HPV. No Mammogram  7. No Chronic Disease - PCP follow up  Maryann Conners, [place] [facility] [place]

## 2020-03-11 NOTE — Patient Instructions (Signed)

## 2020-04-07 ENCOUNTER — Ambulatory Visit: Payer: Medicaid Other | Admitting: Obstetrics and Gynecology

## 2020-05-04 IMAGING — US US OB COMP LESS 14 WK
1 series · 15 of 22 positions shown · non-contrast
Comparison: None.

CLINICAL DATA: Vaginal bleeding during 1st trimester of pregnancy.
Gestational age by LMP of 4 weeks 5 days.

EXAM:
OBSTETRIC <14 WK ULTRASOUND
TECHNIQUE: Transabdominal ultrasound was performed for evaluation of the
gestation as well as the maternal uterus and adnexal regions.
Patient declined transvaginal sonography.

[Series 1: us ob comp less 14 wk · 22 acquisitions, 15 frames shown]
[im 1/22]
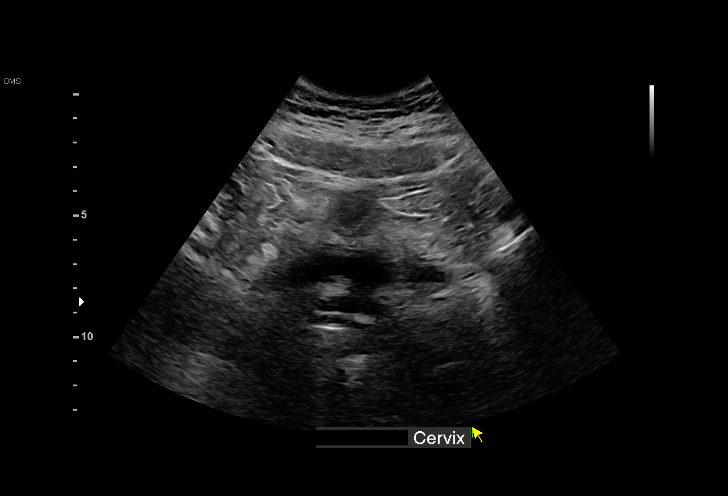
[im 3/22]
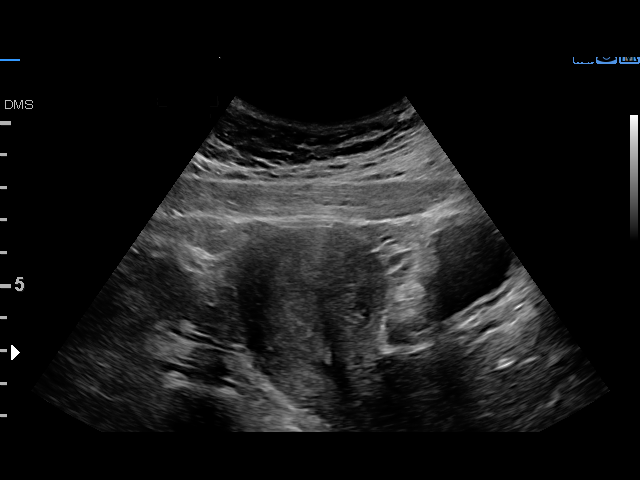
[im 4/22]
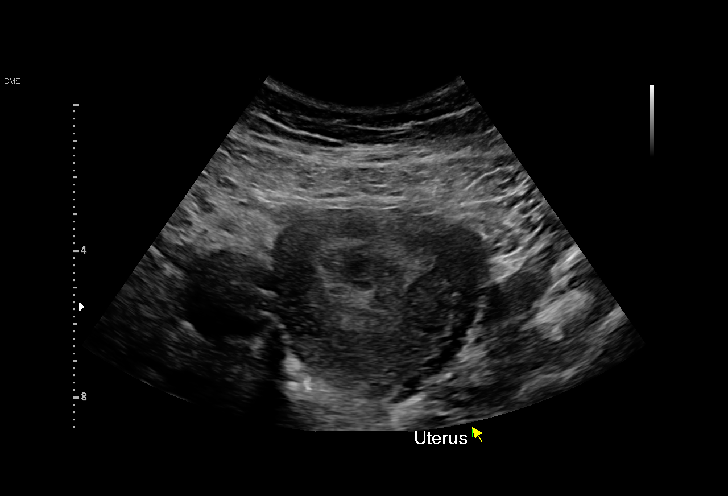
[im 6/22]
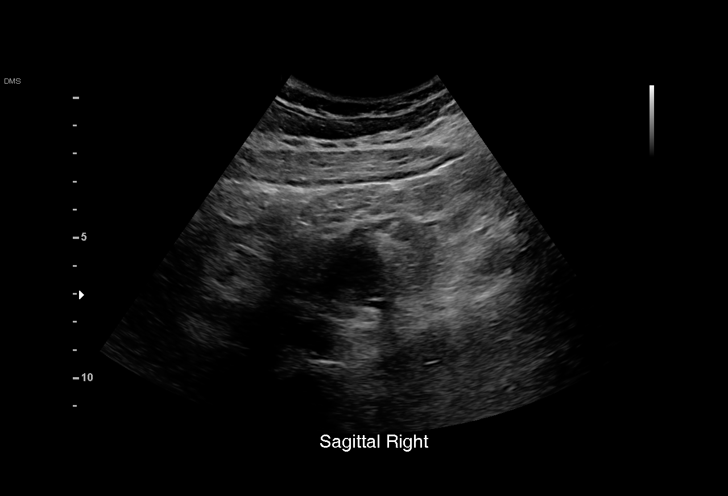
[im 7/22]
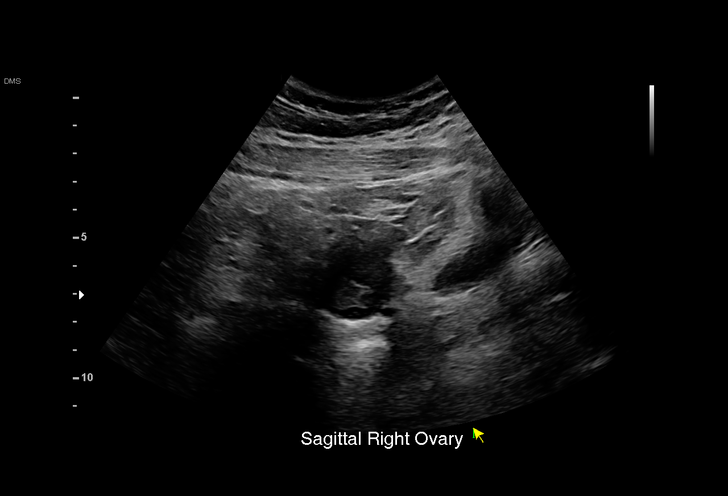
[im 9/22]
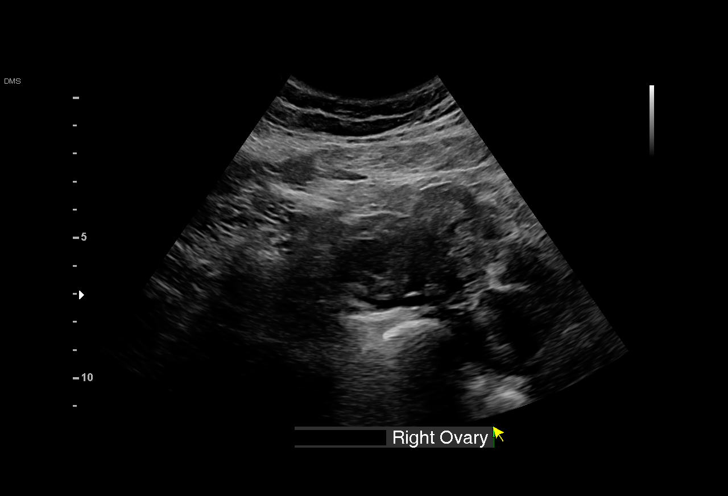
[im 10/22]
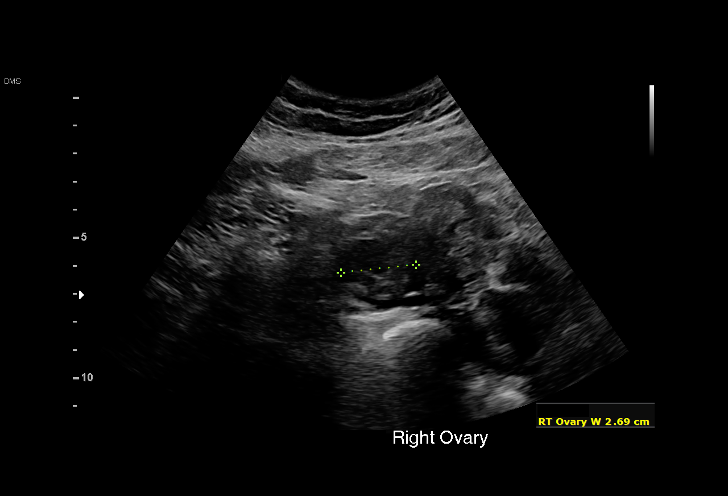
[im 12/22]
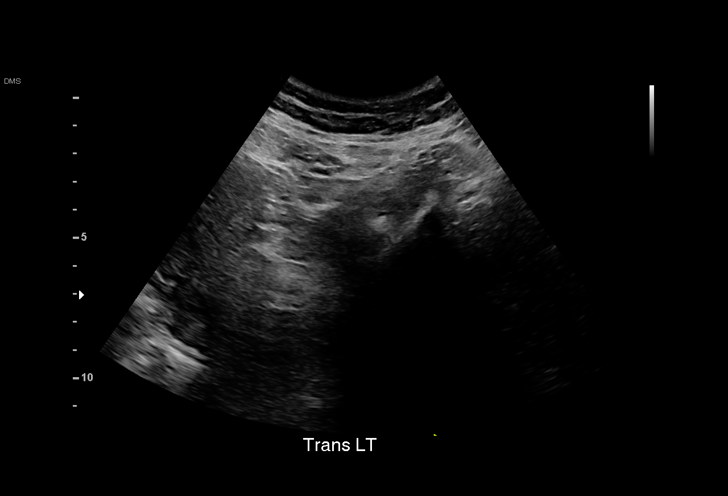
[im 13/22]
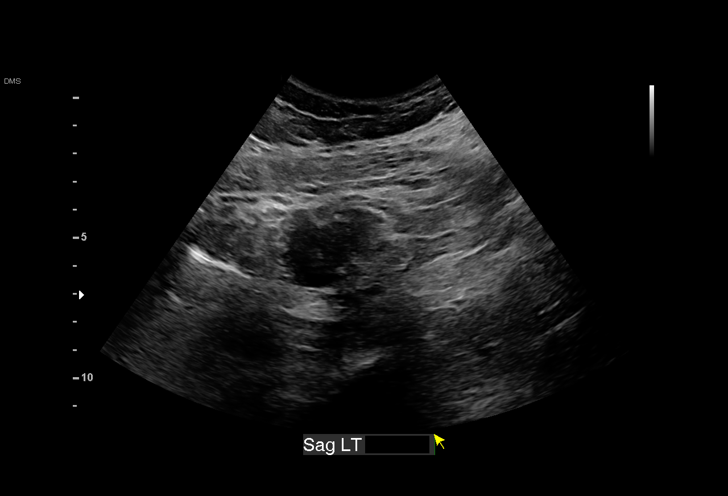
[im 14/22]
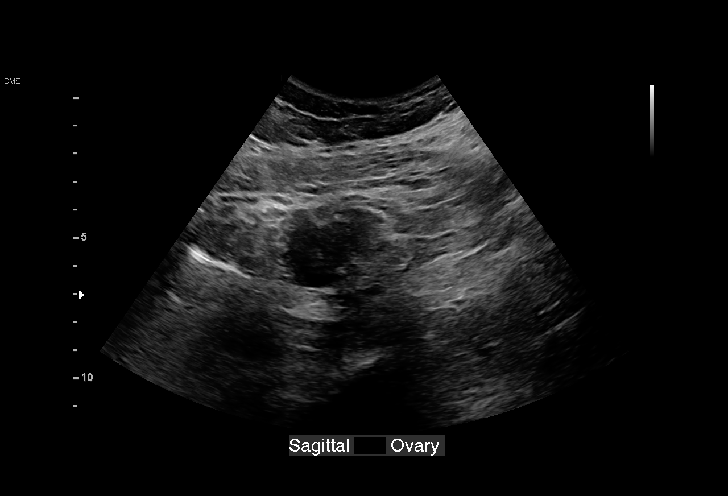
[im 16/22]
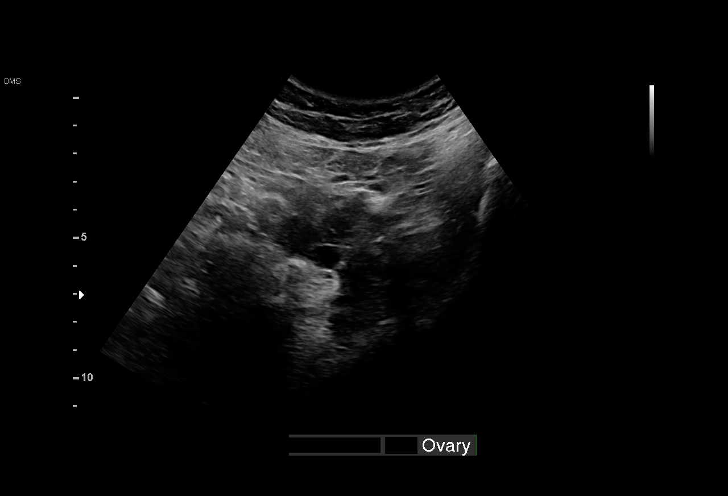
[im 17/22]
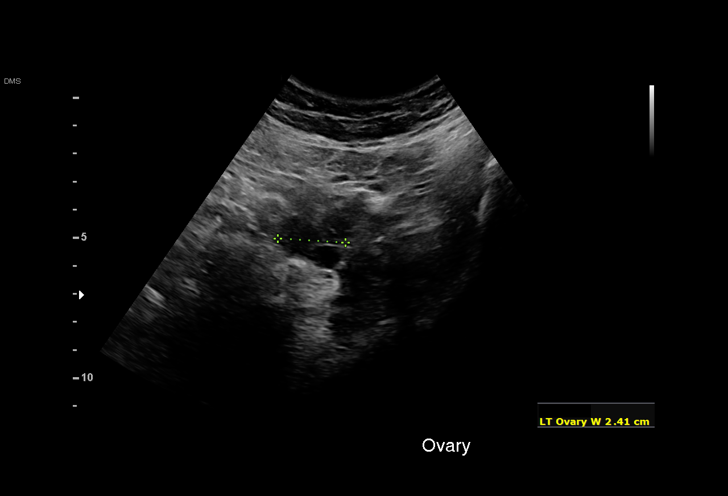
[im 19/22]
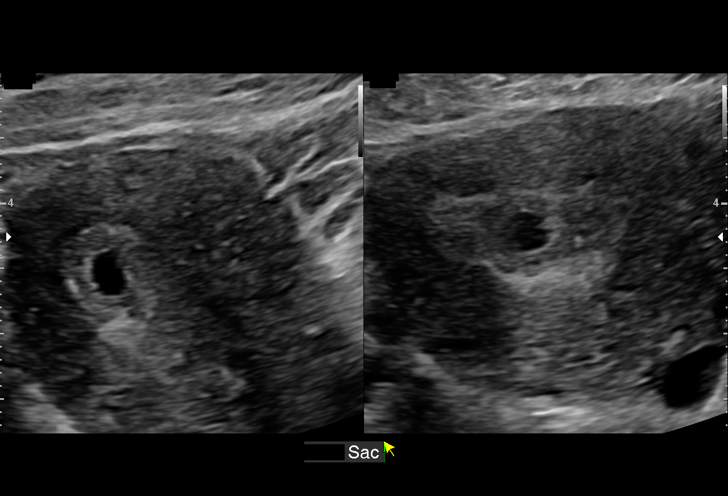
[im 20/22]
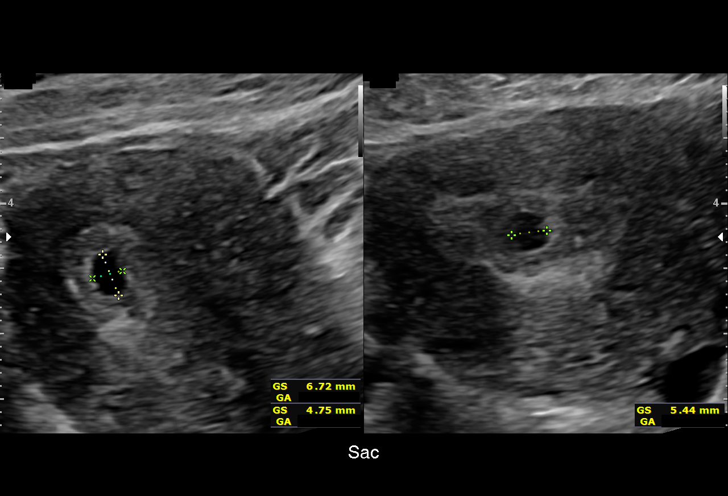
[im 22/22]
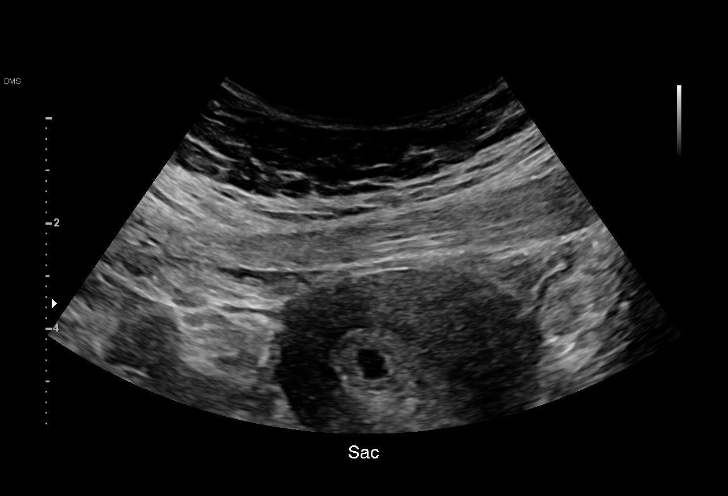

[15 of 22 positions shown; findings below may reference images not displayed]

FINDINGS: Intrauterine gestational sac: Single

Yolk sac:  Not visualized by transabdominal sonography

Embryo:  Not visualized by transabdominal sonography

MSD: 6 mm   5 w   2 d

Subchorionic hemorrhage:  None visualized.

Maternal uterus/adnexae: Both ovaries are normal in appearance. No
mass or abnormal free fluid identified.
IMPRESSION: Single intrauterine gestational sac measuring approximately 5 weeks
with inconclusive viability. Suboptimal visualization as patient
declined transvaginal sonography. Consider following b-hCG levels,
with followup ultrasound to assess viability in 14 days.

## 2020-06-30 ENCOUNTER — Other Ambulatory Visit: Payer: Self-pay

## 2020-06-30 ENCOUNTER — Encounter: Payer: Self-pay | Admitting: Obstetrics and Gynecology

## 2020-06-30 ENCOUNTER — Ambulatory Visit (INDEPENDENT_AMBULATORY_CARE_PROVIDER_SITE_OTHER): Payer: Medicaid Other | Admitting: Obstetrics and Gynecology

## 2020-06-30 VITALS — BP 111/68 | HR 121 | Temp 97.7°F | Ht 61.0 in | Wt 199.0 lb

## 2020-06-30 DIAGNOSIS — Z3009 Encounter for other general counseling and advice on contraception: Secondary | ICD-10-CM | POA: Diagnosis not present

## 2020-06-30 DIAGNOSIS — Z30430 Encounter for insertion of intrauterine contraceptive device: Secondary | ICD-10-CM

## 2020-06-30 DIAGNOSIS — Z3202 Encounter for pregnancy test, result negative: Secondary | ICD-10-CM

## 2020-06-30 LAB — POCT URINE PREGNANCY: Preg Test, Ur: NEGATIVE

## 2020-06-30 MED ORDER — LEVONORGESTREL 20.1 MCG/DAY IU IUD
1.0000 | INTRAUTERINE_SYSTEM | Freq: Once | INTRAUTERINE | Status: AC
  Administered 2020-06-30: 1 via INTRAUTERINE

## 2020-06-30 NOTE — Progress Notes (Signed)
    IUD INSERTION PROCEDURE NOTE  [name] is a 25 y.o. [phone] here for [place] IUD insertion. No GYN concerns.   Patient plans IUD, and will schedule soon.  Risks and benefits of IUD discussed including the risks of irregular bleeding, cramping, infection, malpositioning, expulsion or uterine perforation of the IUD (1:1000 placements)  which may require further procedures such as laparoscopy.  IUDs, while effective at preventing pregnancy, do not prevent transmission of sexually transmitted diseases and use of barrier methods for this purpose was discussed.  Low overall incidence of failure with 99.7% efficacy rate in typical use. The patient has no contraindication to IUD placement. Patient verbalized an understanding of the plan of care and agrees.    She was counseled regarding the risks/benefits of IUD including insertion risk of infection, hemorrhage, damage to surrounding tissue and organs, uterine perforation. She was counseled regarding risks of IUD including implantation into uterine wall, migration outside of uterus, possible need for hysteroscopic or laparoscopic removal, ovarian cysts, expulsion. She was advised that risk of pregnancy is low with negative UPT but is not zero and IUD insertion may cause miscarriage. Reviewed that she is also at slightly higher risk for ectopic pregnancy and she should take a pregnancy test if she believes she may be pregnant. She was advised to use backup method of protection for one week. She verbalized understanding of all of the above and consent signed.   Last intercourse was 2 weeks ago and protected Last pap smear was on 07/30/2019 and was normal UPT today: Negative  IUD Insertion  Patient identified and an adequate time out was performed. Speculum placed in the vagina. The cervix was cleaned with Betadine x 2 and grasped anteriorly with a single tooth tenaculum.  A uterine sound was used to sound the uterus to 9 cm;  the IUD was then placed  per manufacturer's recommendations. Strings trimmed to 6 cm. Tenaculum was removed, good hemostasis noted after application of Silver Nitrate x 2. Patient tolerated procedure well.   Patient was given post-procedure instructions.  She was reminded to have backup contraception for one week during this transition period between IUDs.  Patient was also asked to check IUD strings periodically and follow up in 4 weeks for IUD check.  Liletta IUD Exp: 04/2023 Lot: [id number]  Laury Deep, CNM  06/30/2020 11:26 AM

## 2020-06-30 NOTE — Patient Instructions (Signed)
IUD PLACEMENT POST-PROCEDURE INSTRUCTIONS  1. You may take Ibuprofen, Aleve or Tylenol for pain if needed.  Cramping should resolve within in 24 hours.  2. You may have a small amount of spotting.  You should wear a mini pad for the next few days.  3. You may have intercourse after 24 hours.  If you using this for birth control, it is effective immediately.  4. You need to call if you have any pelvic pain, fever, heavy bleeding or foul smelling vaginal discharge.  Irregular bleeding is common the first several months after having an IUD placed. You do not need to call for this reason unless you are concerned.  5. Shower or bathe as normal  6. You should have a follow-up appointment in 4-8 weeks for a re-check to make sure you are not having any problems.  Levonorgestrel intrauterine device (IUD) What is this medicine? LEVONORGESTREL IUD (LEE voe nor jes trel) is a contraceptive (birth control) device. The device is placed inside the uterus by a health care provider. It is used to prevent pregnancy. Some devices can also be used to treat heavy bleeding that occurs during your period. This medicine may be used for other purposes; ask your health care provider or pharmacist if you have questions. COMMON BRAND NAME(S): Minette [place] What should I tell my health care provider before I take this medicine? They need to know if you have any of these conditions:  abnormal Pap smear  cancer of the breast, uterus, or cervix  diabetes  endometritis  genital or pelvic infection now or in the past  have more than one sexual partner or your partner has more than one partner  heart disease  history of an ectopic or tubal pregnancy  immune system problems  IUD in place  liver disease or tumor  problems with blood clots or take blood-thinners  seizures  use intravenous drugs  uterus of unusual shape  vaginal bleeding that has not been explained  an unusual or  allergic reaction to levonorgestrel, other hormones, silicone, or polyethylene, medicines, foods, dyes, or preservatives  pregnant or trying to get pregnant  breast-feeding How should I use this medicine? This device is placed inside the uterus by a health care professional. Talk to your pediatrician regarding the use of this medicine in children. Special care may be needed. Overdosage: If you think you have taken too much of this medicine contact a poison control center or emergency room at once. NOTE: This medicine is only for you. Do not share this medicine with others. What if I miss a dose? This does not apply. Depending on the brand of device you have inserted, the device will need to be replaced every 3 to 7 years if you wish to continue using this type of birth control. What may interact with this medicine? Do not take this medicine with any of the following medications:  amprenavir  bosentan  fosamprenavir This medicine may also interact with the following medications:  aprepitant  armodafinil  barbiturate medicines for inducing sleep or treating seizures  bexarotene  boceprevir  griseofulvin  medicines to treat seizures like carbamazepine, ethotoin, felbamate, oxcarbazepine, phenytoin, topiramate  modafinil  pioglitazone  rifabutin  rifampin  rifapentine  some medicines to treat HIV infection like atazanavir, efavirenz, indinavir, lopinavir, nelfinavir, tipranavir, ritonavir  St. John's wort  warfarin This list may not describe all possible interactions. Give your health care provider a list of all the medicines, herbs, non-prescription drugs, or  dietary supplements you use. Also tell them if you smoke, drink alcohol, or use illegal drugs. Some items may interact with your medicine. What should I watch for while using this medicine? Visit your doctor or health care professional for regular check ups. See your doctor if you or your partner has sexual  contact with others, becomes HIV positive, or gets a sexual transmitted disease. This product does not protect you against HIV infection (AIDS) or other sexually transmitted diseases. You can check the placement of the IUD yourself by reaching up to the top of your vagina with clean fingers to feel the threads. Do not pull on the threads. It is a good habit to check placement after each menstrual period. Call your doctor right away if you feel more of the IUD than just the threads or if you cannot feel the threads at all. The IUD may come out by itself. You may become pregnant if the device comes out. If you notice that the IUD has come out use a backup birth control method like condoms and call your health care provider. Using tampons will not change the position of the IUD and are okay to use during your period. This IUD can be safely scanned with magnetic resonance imaging (MRI) only under specific conditions. Before you have an MRI, tell your healthcare provider that you have an IUD in place, and which type of IUD you have in place. What side effects may I notice from receiving this medicine? Side effects that you should report to your doctor or health care professional as soon as possible:  allergic reactions like skin rash, itching or hives, swelling of the face, lips, or tongue  fever, flu-like symptoms  genital sores  high blood pressure  no menstrual period for 6 weeks during use  pain, swelling, warmth in the leg  pelvic pain or tenderness  severe or sudden headache  signs of pregnancy  stomach cramping  sudden shortness of breath  trouble with balance, talking, or walking  unusual vaginal bleeding, discharge  yellowing of the eyes or skin Side effects that usually do not require medical attention (report to your doctor or health care professional if they continue or are bothersome):  acne  breast pain  change in sex drive or performance  changes in  weight  cramping, dizziness, or faintness while the device is being inserted  headache  irregular menstrual bleeding within first 3 to 6 months of use  nausea This list may not describe all possible side effects. Call your doctor for medical advice about side effects. You may report side effects to FDA at 1-800-FDA-1088. Where should I keep my medicine? This does not apply. NOTE: This sheet is a summary. It may not cover all possible information. If you have questions about this medicine, talk to your doctor, pharmacist, or health care provider.  2021 [employer] (2019-09-29 16:27:45)

## 2020-07-28 ENCOUNTER — Ambulatory Visit: Payer: Medicaid Other | Admitting: Obstetrics and Gynecology

## 2020-09-20 DIAGNOSIS — J029 Acute pharyngitis, unspecified: Secondary | ICD-10-CM | POA: Diagnosis not present

## 2020-10-03 DIAGNOSIS — G43909 Migraine, unspecified, not intractable, without status migrainosus: Secondary | ICD-10-CM | POA: Diagnosis not present

## 2020-10-03 DIAGNOSIS — Z20822 Contact with and (suspected) exposure to covid-19: Secondary | ICD-10-CM | POA: Diagnosis not present

## 2020-10-10 DIAGNOSIS — H5213 Myopia, bilateral: Secondary | ICD-10-CM | POA: Diagnosis not present

## 2021-10-18 DIAGNOSIS — H6692 Otitis media, unspecified, left ear: Secondary | ICD-10-CM | POA: Diagnosis not present

## 2021-12-18 DIAGNOSIS — Z20822 Contact with and (suspected) exposure to covid-19: Secondary | ICD-10-CM | POA: Diagnosis not present

## 2021-12-18 DIAGNOSIS — J069 Acute upper respiratory infection, unspecified: Secondary | ICD-10-CM | POA: Diagnosis not present

## 2021-12-18 DIAGNOSIS — R059 Cough, unspecified: Secondary | ICD-10-CM | POA: Diagnosis not present

## 2022-02-19 DIAGNOSIS — H6692 Otitis media, unspecified, left ear: Secondary | ICD-10-CM | POA: Diagnosis not present

## 2022-02-19 DIAGNOSIS — H6123 Impacted cerumen, bilateral: Secondary | ICD-10-CM | POA: Diagnosis not present

## 2022-08-09 ENCOUNTER — Ambulatory Visit: Payer: Medicaid Other | Admitting: Family Medicine

## 2022-09-04 ENCOUNTER — Encounter: Payer: Self-pay | Admitting: General Practice

## 2022-09-04 ENCOUNTER — Ambulatory Visit: Payer: Medicaid Other | Admitting: Family Medicine

## 2022-09-23 DIAGNOSIS — J45901 Unspecified asthma with (acute) exacerbation: Secondary | ICD-10-CM | POA: Diagnosis not present

## 2022-09-23 DIAGNOSIS — Z20822 Contact with and (suspected) exposure to covid-19: Secondary | ICD-10-CM | POA: Diagnosis not present

## 2022-09-23 DIAGNOSIS — R059 Cough, unspecified: Secondary | ICD-10-CM | POA: Diagnosis not present
